# Patient Record
Sex: Male | Born: 1995 | Race: Black or African American | Hispanic: No | Marital: Single | Smoking: Current some day smoker
Health system: Southern US, Community
[De-identification: ages and names within clinical notes are randomized; demographics above are authoritative.]

## PROBLEM LIST (undated history)

## (undated) HISTORY — PX: CARDIAC SURGERY: SHX584

---
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
## 2015-11-05 ENCOUNTER — Ambulatory Visit: Payer: Medicaid Other | Attending: Pediatrics | Admitting: Pediatrics

## 2015-11-05 DIAGNOSIS — Q210 Ventricular septal defect: Secondary | ICD-10-CM | POA: Diagnosis not present

## 2017-08-26 ENCOUNTER — Other Ambulatory Visit: Payer: Self-pay

## 2017-08-26 ENCOUNTER — Emergency Department
Admission: EM | Admit: 2017-08-26 | Discharge: 2017-08-26 | Disposition: A | Discharge status: Home / Self Care | Payer: Managed Care, Other (non HMO) | Attending: Student in an Organized Health Care Education/Training Program | Admitting: Student in an Organized Health Care Education/Training Program

## 2017-08-26 ENCOUNTER — Encounter: Payer: Self-pay | Admitting: Emergency Medicine

## 2017-08-26 ENCOUNTER — Emergency Department: Payer: Managed Care, Other (non HMO)

## 2017-08-26 DIAGNOSIS — M79645 Pain in left finger(s): Secondary | ICD-10-CM | POA: Diagnosis present

## 2017-08-26 DIAGNOSIS — F17200 Nicotine dependence, unspecified, uncomplicated: Secondary | ICD-10-CM | POA: Diagnosis not present

## 2017-08-26 MED ORDER — TRAMADOL HCL 50 MG PO TABS: 50.0000 mg | ORAL_TABLET | Freq: Four times a day (QID) | ORAL | 0 refills | Status: AC | PRN

## 2017-08-26 MED ORDER — CEPHALEXIN 500 MG PO CAPS: 500.0000 mg | ORAL_CAPSULE | Freq: Three times a day (TID) | ORAL | 0 refills | Status: AC

## 2017-08-26 NOTE — Discharge Instructions (Signed)
Please move her hand is much as possible to help with any swelling.  Take antibiotics 3 times daily.  Continue taking Motrin and Tylenol to help with inflammation and discomfort.  The pain medication prescribed is for severe breakthrough pain.  Return to the ER for any fevers, worsening swelling, redness that persists particularly on the palm side of your hand for any additional questions or concerns.

## 2017-08-26 NOTE — ED Provider Notes (Signed)
[facility] Emergency Department Provider Note    First MD Initiated Contact with Patient 08/26/17 2218     (approximate)  I have reviewed the triage vital signs and the nursing notes.   HISTORY  Chief Complaint Hand Pain    HPI Steven Stein is a 22 y.o. male presents with several days of worsening left ring finger swelling and joint pain.  Pain is worse when making a fist.  No fevers.  No redness.  Denies any trauma but works with his hands doing frequent repetitive movements.  Pain is located mostly on the dorsal aspect of the left fourth digit particularly over the PIP.  There is painless passive range of motion.  Pain is primary located on the dorsal aspect along the extensor surface.  There is no fusiform swelling, erythema or tenderness over the flexor tendon sheath.  Neurovascularly intact distally.  Back side.  Has noted a few bumps and wonders if he got bit by something but does not recall anything.  No other systemic symptoms.  History reviewed. No pertinent past medical history. History reviewed. No pertinent family history. Past Surgical History:  Procedure Laterality Date  . CARDIAC SURGERY     There are no active problems to display for this patient.     Prior to Admission medications   Not on File    Allergies Patient has no allergy information on record.    Social History Social History   Tobacco Use  . Smoking status: Current Some Day Smoker  . Smokeless tobacco: Never Used  Substance Use Topics  . Alcohol use: Yes  . Drug use: Yes    Types: Marijuana    Comment: last smoked today    Review of Systems Patient denies headaches, rhinorrhea, blurry vision, numbness, shortness of breath, chest pain, edema, cough, abdominal pain, nausea, vomiting, diarrhea, dysuria, fevers, rashes or hallucinations unless otherwise stated above in HPI. ____________________________________________   PHYSICAL EXAM:  VITAL  SIGNS: Vitals:   08/26/17 2107  BP: (!) 157/92  Pulse: (!) 53  Resp: 17  Temp: 98.3 F (36.8 C)  SpO2: 98%    Constitutional: Alert and oriented. Well appearing and in no acute distress. Eyes: Conjunctivae are normal.  Head: Atraumatic. Nose: No congestion/rhinnorhea. Mouth/Throat: Mucous membranes are moist.   Neck: Painless ROM.  Cardiovascular:   Good peripheral circulation. Respiratory: Normal respiratory effort.  No retractions.  Gastrointestinal: Soft and nontender.  Musculoskeletal: No lower extremity tenderness .  No joint effusions.  Left hand does have some swelling and edema to Neurologic:  Normal speech and language. No gross focal neurologic deficits are appreciated.  Skin:  Skin is warm, dry and intact. No rash noted. Psychiatric: Mood and affect are normal. Speech and behavior are normal.  ____________________________________________   LABS (all labs ordered are listed, but only abnormal results are displayed)  No results found for this or any previous visit (from the past 24 hour(s)). ____________________________________________ ____________________________________________  RADIOLOGY  I personally reviewed all radiographic images ordered to evaluate for the above acute complaints and reviewed radiology reports and findings.  These findings were personally discussed with the patient.  Please see medical record for radiology report.  ____________________________________________   PROCEDURES  Procedure(s) performed:  Procedures    Critical Care performed: no ____________________________________________   INITIAL IMPRESSION / ASSESSMENT AND PLAN / ED COURSE  Pertinent labs & imaging results that were available during my care of the patient were reviewed by me and considered in my medical  decision making (see chart for details).  DDX: Tenosynovitis, cellulitis, arthritis, fracture, dislocation  Steven Stein is a 22 y.o. who presents to the ED  with symptoms as described above.  Not clinically consistent with flexor tenosynovitis.  No evidence of fracture.  Does not recall injury to suggest dislocation and otherwise has good range of motion without any instability.  Given the dorsal swelling possible insect bite with some warmth will treat with Keflex for possible component of cellulitis.  Will have patient follow-up with outpatient hand specialist.  Discussed signs and symptoms for which she should return to the ER.      ____________________________________________   FINAL CLINICAL IMPRESSION(S) / ED DIAGNOSES  Final diagnoses:  Finger pain, left      NEW MEDICATIONS STARTED DURING THIS VISIT:  New Prescriptions   No medications on file     Note:  This document was prepared using Dragon voice recognition software and may include unintentional dictation errors.     Willy Eddy, MD 08/26/17 2239

## 2017-08-26 NOTE — ED Triage Notes (Addendum)
Pt reports pain and swelling to 3rd and 4th digits of left hand for about 1 week; pt says symptoms are not getting any better; redness and warmth noted to fingers; denies injury; pt says areas were itching earlier; now now pain unless he makes a fist with that hand

## 2017-08-26 NOTE — ED Notes (Signed)
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

## 2019-06-12 ENCOUNTER — Other Ambulatory Visit: Payer: Self-pay

## 2019-06-12 DIAGNOSIS — F17200 Nicotine dependence, unspecified, uncomplicated: Secondary | ICD-10-CM | POA: Insufficient documentation

## 2019-06-12 DIAGNOSIS — W11XXXA Fall on and from ladder, initial encounter: Secondary | ICD-10-CM | POA: Insufficient documentation

## 2019-06-12 DIAGNOSIS — Y999 Unspecified external cause status: Secondary | ICD-10-CM | POA: Insufficient documentation

## 2019-06-12 DIAGNOSIS — M25551 Pain in right hip: Secondary | ICD-10-CM | POA: Insufficient documentation

## 2019-06-12 DIAGNOSIS — Y929 Unspecified place or not applicable: Secondary | ICD-10-CM | POA: Insufficient documentation

## 2019-06-12 DIAGNOSIS — Y939 Activity, unspecified: Secondary | ICD-10-CM | POA: Insufficient documentation

## 2019-06-12 DIAGNOSIS — S20211A Contusion of right front wall of thorax, initial encounter: Secondary | ICD-10-CM | POA: Insufficient documentation

## 2019-06-12 NOTE — ED Triage Notes (Signed)
Pt states he fell off aprox 4 foot ladder a week ago and is c/o of right side and right shoulder pain.  Pt aox4, NAD noted. No swelling or obvious deformity noted.

## 2019-06-13 ENCOUNTER — Emergency Department: Payer: Self-pay

## 2019-06-13 ENCOUNTER — Emergency Department
Admission: EM | Admit: 2019-06-13 | Discharge: 2019-06-13 | Disposition: A | Discharge status: Home / Self Care | Payer: Self-pay | Attending: Emergency Medicine | Admitting: Emergency Medicine

## 2019-06-13 DIAGNOSIS — S20211A Contusion of right front wall of thorax, initial encounter: Secondary | ICD-10-CM

## 2019-06-13 DIAGNOSIS — W19XXXA Unspecified fall, initial encounter: Secondary | ICD-10-CM

## 2019-06-13 MED ORDER — MELOXICAM 7.5 MG PO TABS
15.0000 mg | ORAL_TABLET | Freq: Once | ORAL | Status: AC
  Administered 2019-06-13: 15 mg via ORAL
  Filled 2019-06-13: qty 2

## 2019-06-13 MED ORDER — MELOXICAM 7.5 MG PO TABS: 7.5000 mg | ORAL_TABLET | Freq: Two times a day (BID) | ORAL | 0 refills | Status: AC

## 2019-06-13 NOTE — ED Provider Notes (Signed)
[facility] Emergency Department Provider Note  Time seen: 1:51 AM  I have reviewed the triage vital signs and the nursing notes.   HISTORY  Chief Complaint Fall   HPI Steven Stein is a 23 y.o. male with no past medical history presents to the emergency department for right-sided pain after falling off a ladder 1 week ago.  According to the patient he is approximately 4 feet up on a ladder when he fell off 1 week ago landing on his right side.  Patient states mild pain to his right chest after the fall however several days later he developed more significant pain to this area.  States he has had mild pain to the right hip as well but able to ambulate.  Patient states he was at work today when he stretched and felt like he injured the right side of his chest.  States intermittent pain denies any pain currently states when it does occur it is sharp and last several seconds.  History reviewed. No pertinent past medical history.  There are no problems to display for this patient.   Past Surgical History:  Procedure Laterality Date  . CARDIAC SURGERY      Prior to Admission medications   Not on File    No Known Allergies  History reviewed. No pertinent family history.  Social History Social History   Tobacco Use  . Smoking status: Current Some Day Smoker  . Smokeless tobacco: Never Used  Substance Use Topics  . Alcohol use: Yes  . Drug use: Yes    Types: Marijuana    Comment: last smoked today    Review of Systems Constitutional: Negative for fever. Cardiovascular: Negative for chest pain. Respiratory: Negative for shortness of breath. Gastrointestinal: Negative for abdominal pain Musculoskeletal: Right chest wall pain.  Right hip pain Neurological: Negative for headache All other ROS negative  ____________________________________________   PHYSICAL EXAM:  VITAL SIGNS: ED Triage Vitals  Enc Vitals Group     BP 06/12/19 2130 (!)  150/89     Pulse Rate 06/12/19 2130 70     Resp 06/12/19 2130 18     Temp 06/12/19 2130 98.8 F (37.1 C)     Temp Source 06/12/19 2130 Oral     SpO2 06/12/19 2130 100 %     Weight 06/12/19 2132 132 lb (59.9 kg)     Height 06/12/19 2132 5\' 6"  (1.676 m)     Head Circumference --      Peak Flow --      Pain Score 06/12/19 2131 6     Pain Loc --      Pain Edu? --      Excl. in GC? --    Constitutional: Alert and oriented. Well appearing and in no distress. Eyes: Normal exam ENT      Head: Normocephalic and atraumatic.      Mouth/Throat: Mucous membranes are moist. Cardiovascular: Normal rate, regular rhythm.  Respiratory: Normal respiratory effort without tachypnea nor retractions. Breath sounds are clear.  Nontender to palpation. Gastrointestinal: Soft and nontender. No distention.   Musculoskeletal: Normal range of motion all extremities including right hip with no tenderness elicited. Neurologic:  Normal speech and language. No gross focal neurologic deficits  Skin:  Skin is warm, dry and intact.  Psychiatric: Mood and affect are normal.   ____________________________________________     RADIOLOGY  IMPRESSION:  1. No acute cardiopulmonary findings. No visible displaced rib  fracture or pneumothorax.  2. Remote postsurgical  changes from prior sternotomy with fracture  of the inferior most wire.   ____________________________________________   INITIAL IMPRESSION / ASSESSMENT AND PLAN / ED COURSE  Pertinent labs & imaging results that were available during my care of the patient were reviewed by me and considered in my medical decision making (see chart for details).   Patient presents to the emergency department for a fall 1 week ago with continued pain to his right rib cage and right hip.  States he was stretching tonight and felt more significant pain in the right ribs is concerned he could have injured something.  We will obtain chest x-ray as a precaution with right  rib films.  Patient has a nontender hip with great range of motion likely contusion.  X-ray negative for acute rib fracture.  We will discharge with meloxicam and PCP follow-up.  Steven Stein was evaluated in Emergency Department on 06/13/2019 for the symptoms described in the history of present illness. He was evaluated in the context of the global COVID-19 pandemic, which necessitated consideration that the patient might be at risk for infection with the SARS-CoV-2 virus that causes COVID-19. Institutional protocols and algorithms that pertain to the evaluation of patients at risk for COVID-19 are in a state of rapid change based on information released by regulatory bodies including the CDC and federal and state organizations. These policies and algorithms were followed during the patient's care in the ED.  ____________________________________________   FINAL CLINICAL IMPRESSION(S) / ED DIAGNOSES  Fall Contusion   [name], MD 06/13/19 [phone]

## 2020-10-10 IMAGING — CR DG RIBS W/ CHEST 3+V*R*
5 series · 5 of 5 positions shown · non-contrast
Comparison: None.

CLINICAL DATA: Fall from ladder with right chest and shoulder pain

EXAM:
RIGHT RIBS AND CHEST - 3+ VIEW

[chest pa]
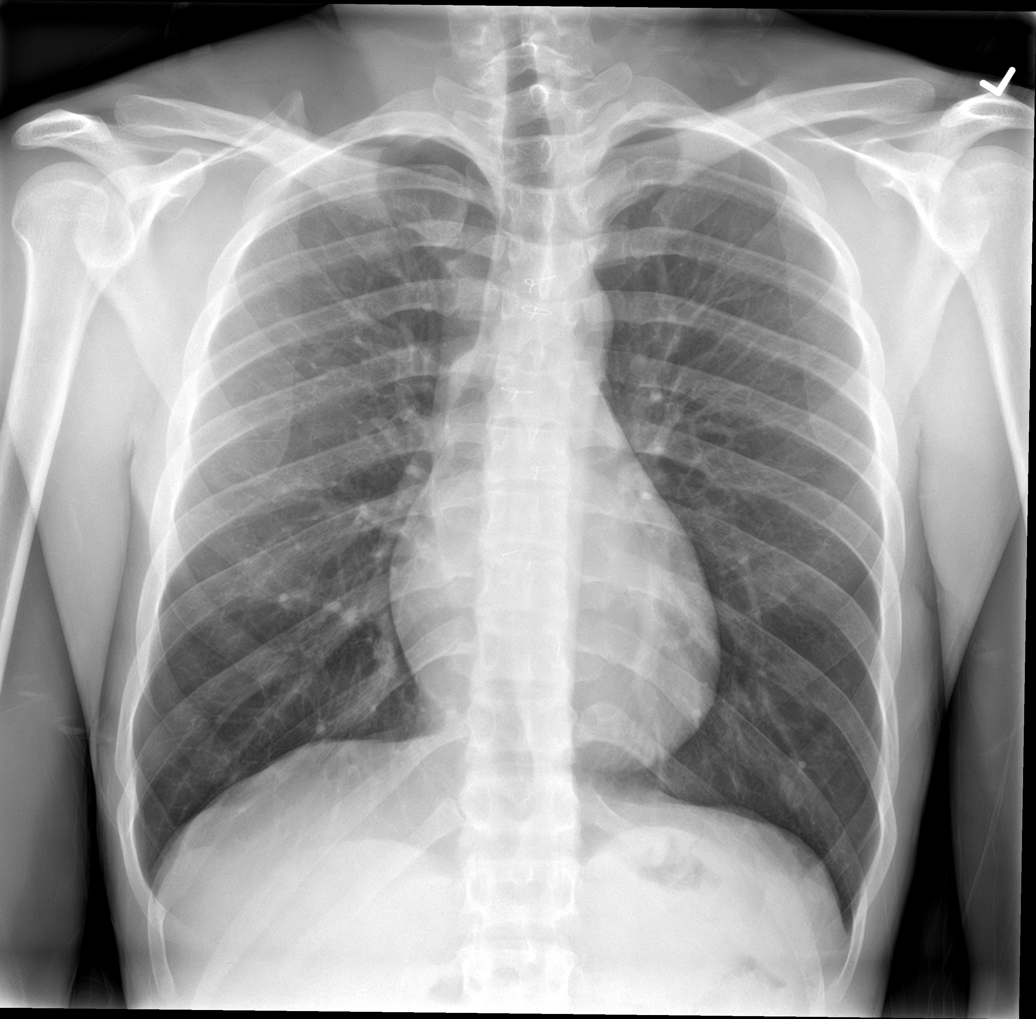

[rib pa]
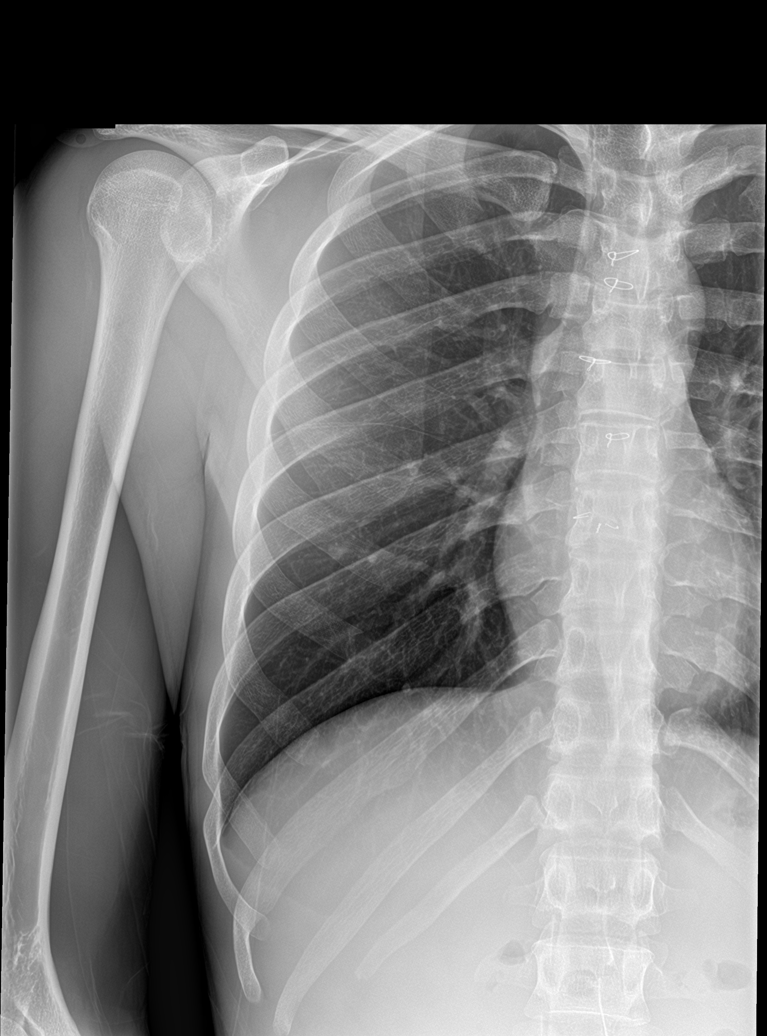

[rib pa obl (1 of 2)]
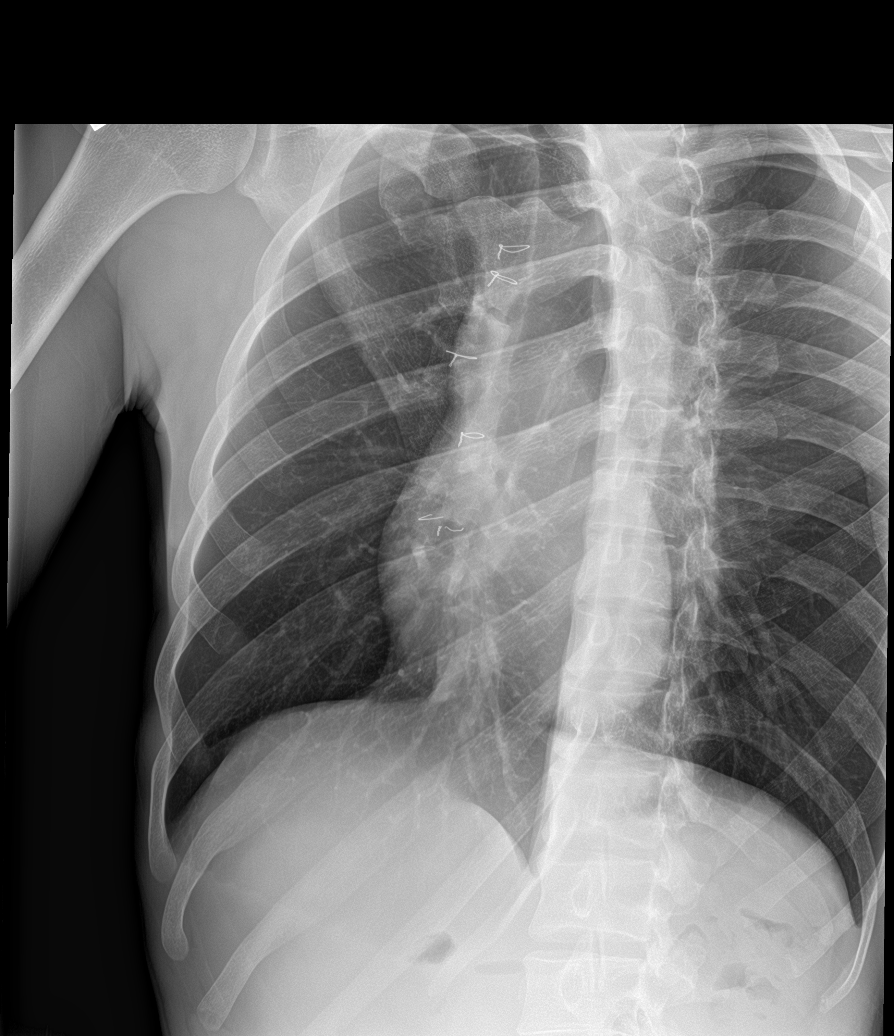

[rib ap]
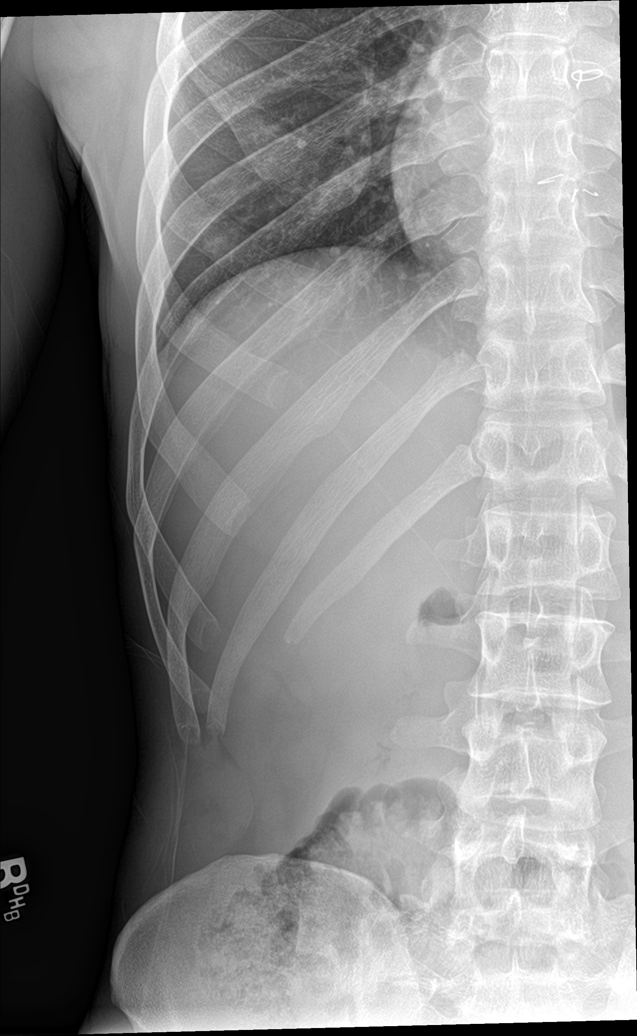

[rib pa obl (2 of 2)]
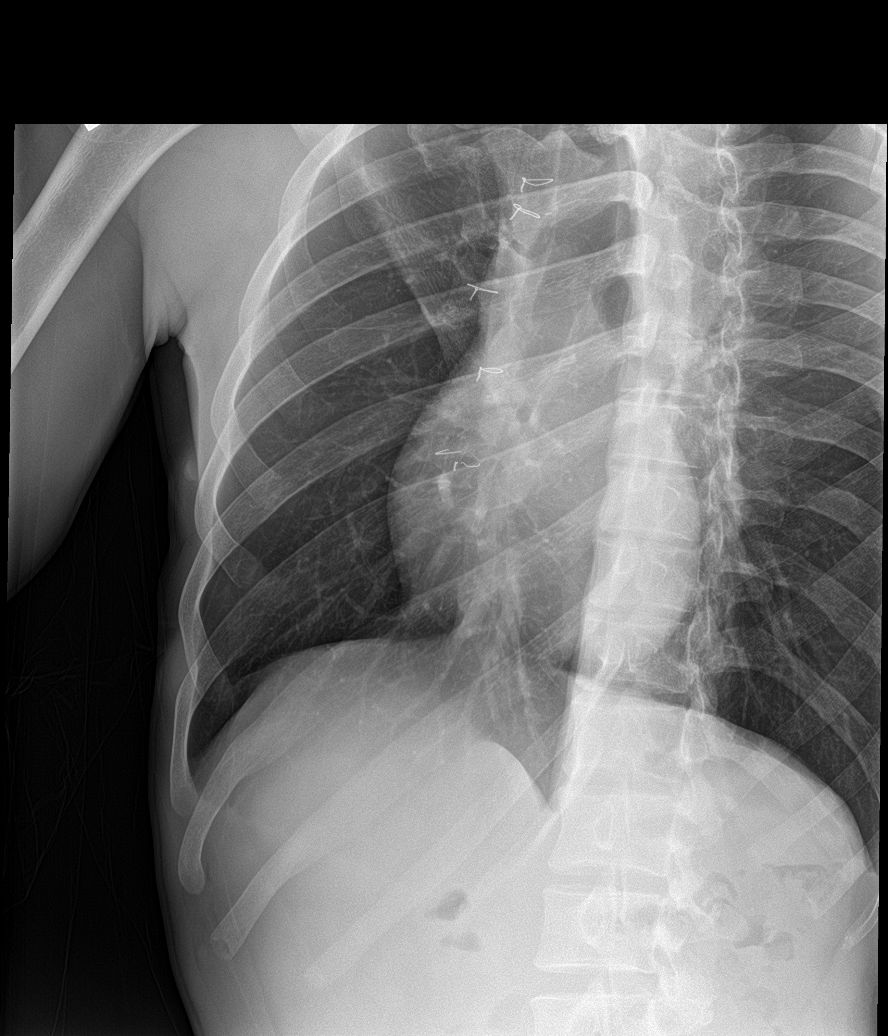

[5 of 5 positions shown; findings below may reference images not displayed]

FINDINGS: Postsurgical changes from prior sternotomy with fracture of the
inferior most wire. Remaining sternal sutures are intact. No visible
displaced rib fracture is seen. No acute osseous abnormality or
suspicious osseous lesions. No consolidation, features of edema,
pneumothorax, or effusion. The cardiomediastinal contours are
unremarkable. No acute soft tissue abnormality.
IMPRESSION: 1. No acute cardiopulmonary findings. No visible displaced rib
fracture or pneumothorax.
2. Remote postsurgical changes from prior sternotomy with fracture
of the inferior most wire.

## 2022-02-18 ENCOUNTER — Encounter: Payer: Self-pay | Admitting: Emergency Medicine

## 2022-02-18 ENCOUNTER — Emergency Department
Admission: EM | Admit: 2022-02-18 | Discharge: 2022-02-18 | Disposition: A | Discharge status: Home / Self Care | Payer: Self-pay | Attending: Emergency Medicine | Admitting: Emergency Medicine

## 2022-02-18 ENCOUNTER — Emergency Department: Payer: Self-pay

## 2022-02-18 DIAGNOSIS — X500XXA Overexertion from strenuous movement or load, initial encounter: Secondary | ICD-10-CM | POA: Insufficient documentation

## 2022-02-18 DIAGNOSIS — R072 Precordial pain: Secondary | ICD-10-CM | POA: Insufficient documentation

## 2022-02-18 DIAGNOSIS — R0789 Other chest pain: Secondary | ICD-10-CM

## 2022-02-18 LAB — BASIC METABOLIC PANEL
Anion gap: 10 (ref 5–15)
BUN: 16 mg/dL (ref 6–20)
CO2: 25 mmol/L (ref 22–32)
Calcium: 9.3 mg/dL (ref 8.9–10.3)
Chloride: 103 mmol/L (ref 98–111)
Creatinine, Ser: 0.98 mg/dL (ref 0.61–1.24)
GFR, Estimated: >60 mL/min (ref >60)
Glucose, Bld: 90 mg/dL (ref 70–99)
Potassium: 3.8 mmol/L (ref 3.5–5.1)
Sodium: 138 mmol/L (ref 135–145)

## 2022-02-18 LAB — CBC
HCT: 44.7 % (ref 39.0–52.0)
Hemoglobin: 14.5 g/dL (ref 13.0–17.0)
MCH: 31.4 pg (ref 26.0–34.0)
MCHC: 32.4 g/dL (ref 30.0–36.0)
MCV: 96.8 fL (ref 80.0–100.0)
Platelets: 307 10*3/uL (ref 150–400)
RBC: 4.62 MIL/uL (ref 4.22–5.81)
RDW: 12.1 % (ref 11.5–15.5)
WBC: 8 10*3/uL (ref 4.0–10.5)
nRBC: 0 % (ref 0.0–0.2)

## 2022-02-18 LAB — TROPONIN I (HIGH SENSITIVITY)
Troponin I (High Sensitivity): 3 ng/L (ref <18)
Troponin I (High Sensitivity): 5 ng/L (ref <18)

## 2022-02-18 MED ORDER — MELOXICAM 15 MG PO TABS: 15.0000 mg | ORAL_TABLET | Freq: Every day | ORAL | 2 refills | Status: DC

## 2022-02-18 NOTE — ED Notes (Signed)
See triage note.  Ambulatory to room.  Says chest pain for a long time.  Says when he moves it happens.  Midchest.  And top right of shoulder.

## 2022-02-18 NOTE — ED Triage Notes (Signed)
Pt presents via POV with complaints of left sided CP that has been present for several months intermmittently but tonight was much worse. Rates the pain a 6/10 when he is having the episodes - no pain at this time. No meds taken PTA. Denies fevers, chills, cough, or SOB.

## 2022-02-18 NOTE — Discharge Instructions (Signed)
Try scheduling an appointment with one of the above discount clinic since she does not have health insurance.  You need a regular physical.  Take the meloxicam daily as prescribed.  Do not take ibuprofen or Aleve with this medication.  You may take Tylenol with this medication.  Stop vaping and smoking this is not helping the situation.  Return if worsening

## 2022-02-18 NOTE — ED Provider Notes (Addendum)
[facility] Provider Note    Event Date/Time   First MD Initiated Contact with Patient 02/18/22 [phone]     (approximate)   History   Chest Pain   HPI  Steven Stein is a 26 y.o. male with no significant past medical history presents emergency department complaining of midsternal chest pain that radiates to the right shoulder.  Not associated with eating.  Patient does work lifting heavy objects like furniture.  States the pain comes and goes.  Is been going on for "a minute ".  I try to qualify that the minute better and he states been going on for almost a year.  He denies any diaphoresis, vomiting, shortness of breath.      Physical Exam   Triage Vital Signs: ED Triage Vitals  Enc Vitals Group     BP 02/18/22 0047 (!) 137/105     Pulse Rate 02/18/22 0047 (!) 51     Resp 02/18/22 0047 20     Temp 02/18/22 0047 97.8 F (36.6 C)     Temp Source 02/18/22 0047 Oral     SpO2 02/18/22 0047 100 %     Weight 02/18/22 0050 136 lb 11 oz (62 kg)     Height 02/18/22 0049 5\' 6"  (1.676 m)     Head Circumference --      Peak Flow --      Pain Score 02/18/22 0049 0     Pain Loc --      Pain Edu? --      Excl. in GC? --     Most recent vital signs: Vitals:   02/18/22 0336 02/18/22 0644  BP: 129/84 129/81  Pulse: 60 64  Resp: 15 16  Temp: 97.6 F (36.4 C) 97.6 F (36.4 C)  SpO2: 100% 100%     General: Awake, no distress.    CV:  Good peripheral perfusion.  regular rate and  rhythm Resp:  Normal effort. Lungs  Abd:  No distention.   Other:  Chest wall is nontender to palpation, pain is reproduced and the patient reached overhead and laid back on the stretcher.   ED Results / Procedures / Treatments   Labs (all labs ordered are listed, but only abnormal results are displayed) Labs Reviewed  BASIC METABOLIC PANEL  CBC  TROPONIN I (HIGH SENSITIVITY)  TROPONIN I (HIGH SENSITIVITY)     EKG  EKG    RADIOLOGY  Chest  x-ray    PROCEDURES:   Procedures   MEDICATIONS ORDERED IN ED: Medications - No data to display   IMPRESSION / MDM / ASSESSMENT AND PLAN / ED COURSE  I reviewed the triage vital signs and the nursing notes.                              Differential diagnosis includes, but is not limited to, chest wall pain, MI, muscle strain, reflux, esophagitis  Patient's presentation is most consistent with acute presentation with potential threat to life or bodily function.   The patient's labs are reassuring, CBC, metabolic panel and troponin are all normal  Chest x-ray independently reviewed and interpreted by me as being negative for any acute abnormality  EKG is negative for STEMI  I did explain everything to the patient.  His pain is reproduced with movement.  He does not want a muscle relaxer as he states it tightens up his bowels.  Explained to him I  can give him a anti-inflammatory takes once a day.  He is not to take other anti-inflammatories.  Patient does smoke and vape I did advise him to stop smoking and vaping as this is not helping his situation.  He states he understands.  Patient was discharged with a work note and a prescription for meloxicam.      FINAL CLINICAL IMPRESSION(S) / ED DIAGNOSES   Final diagnoses:  Chest wall pain     Rx / DC Orders   ED Discharge Orders          Ordered    meloxicam (MOBIC) 15 MG tablet  Daily        02/18/22 0733             Note:  This document was prepared using Dragon voice recognition software and may include unintentional dictation errors.    Faythe Ghee, PA-C 02/18/22 0734    Faythe Ghee, PA-C 02/18/22 4174    Sharman Cheek, MD 02/18/22 [phone]

## 2022-07-20 ENCOUNTER — Encounter (HOSPITAL_COMMUNITY): Payer: Self-pay

## 2022-07-20 ENCOUNTER — Emergency Department (HOSPITAL_COMMUNITY)
Admission: EM | Admit: 2022-07-20 | Discharge: 2022-07-20 | Discharge status: Against Medical Advice | Payer: Self-pay | Attending: Emergency Medicine | Admitting: Emergency Medicine

## 2022-07-20 DIAGNOSIS — Y92838 Other recreation area as the place of occurrence of the external cause: Secondary | ICD-10-CM | POA: Insufficient documentation

## 2022-07-20 DIAGNOSIS — Z5321 Procedure and treatment not carried out due to patient leaving prior to being seen by health care provider: Secondary | ICD-10-CM | POA: Insufficient documentation

## 2022-07-20 DIAGNOSIS — W1830XA Fall on same level, unspecified, initial encounter: Secondary | ICD-10-CM | POA: Insufficient documentation

## 2022-07-20 DIAGNOSIS — S30810A Abrasion of lower back and pelvis, initial encounter: Secondary | ICD-10-CM | POA: Insufficient documentation
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
## 2022-11-08 ENCOUNTER — Other Ambulatory Visit (HOSPITAL_COMMUNITY)
Admission: EM | Admit: 2022-11-08 | Discharge: 2022-11-09 | Disposition: A | Discharge status: Home / Self Care | Payer: 59 | Attending: Psychiatry | Admitting: Psychiatry

## 2022-11-08 DIAGNOSIS — F1020 Alcohol dependence, uncomplicated: Secondary | ICD-10-CM | POA: Diagnosis present

## 2022-11-08 DIAGNOSIS — F330 Major depressive disorder, recurrent, mild: Secondary | ICD-10-CM | POA: Diagnosis not present

## 2022-11-08 DIAGNOSIS — F17210 Nicotine dependence, cigarettes, uncomplicated: Secondary | ICD-10-CM | POA: Diagnosis not present

## 2022-11-08 LAB — POCT URINE DRUG SCREEN - MANUAL ENTRY (I-SCREEN)
POC Amphetamine UR: POSITIVE — AB
POC Buprenorphine (BUP): NOT DETECTED
POC Cocaine UR: NOT DETECTED
POC Marijuana UR: NOT DETECTED
POC Methadone UR: NOT DETECTED
POC Methamphetamine UR: NOT DETECTED
POC Morphine: NOT DETECTED
POC Oxazepam (BZO): NOT DETECTED
POC Oxycodone UR: NOT DETECTED
POC Secobarbital (BAR): NOT DETECTED

## 2022-11-08 MED ORDER — THIAMINE HCL 100 MG/ML IJ SOLN
100.0000 mg | Freq: Once | INTRAMUSCULAR | Status: AC
  Administered 2022-11-08: 100 mg via INTRAMUSCULAR
  Filled 2022-11-08: qty 2

## 2022-11-08 MED ORDER — LORAZEPAM 1 MG PO TABS: 1.0000 mg | ORAL_TABLET | Freq: Four times a day (QID) | ORAL | Status: DC | PRN

## 2022-11-08 MED ORDER — MAGNESIUM HYDROXIDE 400 MG/5ML PO SUSP: 30.0000 mL | Freq: Every day | ORAL | Status: DC | PRN

## 2022-11-08 MED ORDER — ACETAMINOPHEN 325 MG PO TABS: 650.0000 mg | ORAL_TABLET | Freq: Four times a day (QID) | ORAL | Status: DC | PRN

## 2022-11-08 MED ORDER — ONDANSETRON 4 MG PO TBDP: 4.0000 mg | ORAL_TABLET | Freq: Four times a day (QID) | ORAL | Status: DC | PRN

## 2022-11-08 MED ORDER — THIAMINE MONONITRATE 100 MG PO TABS
100.0000 mg | ORAL_TABLET | Freq: Every day | ORAL | Status: DC
  Administered 2022-11-09: 100 mg via ORAL
  Filled 2022-11-08: qty 1

## 2022-11-08 MED ORDER — LOPERAMIDE HCL 2 MG PO CAPS: 2.0000 mg | ORAL_CAPSULE | ORAL | Status: DC | PRN

## 2022-11-08 MED ORDER — ALUM & MAG HYDROXIDE-SIMETH 200-200-20 MG/5ML PO SUSP: 30.0000 mL | ORAL | Status: DC | PRN

## 2022-11-08 MED ORDER — ADULT MULTIVITAMIN W/MINERALS CH
1.0000 | ORAL_TABLET | Freq: Every day | ORAL | Status: DC
  Administered 2022-11-09: 1 via ORAL
  Filled 2022-11-08: qty 1

## 2022-11-08 MED ORDER — HYDROXYZINE HCL 25 MG PO TABS
25.0000 mg | ORAL_TABLET | Freq: Four times a day (QID) | ORAL | Status: DC | PRN
  Administered 2022-11-08: 25 mg via ORAL
  Filled 2022-11-08: qty 1

## 2022-11-08 NOTE — Progress Notes (Signed)
Pt arrived at [facility] with a friend. Pt was seen by himself by this clinician. Pt says he "can't focus right, putting myself down, not thinking right." He works but feels like his attention is scattered. Pt says he is not suicidal. No prevous attempts. Pt says he drinks "in spells, I try hard not to." He says he is an "occasional drinker." He drinks malt liquor or wine. Describes drinking socially but has been drinking more at home lately. Pt reports drinking half a bottle of wine PTA. Smokes marijuana <1x/M. Pt denies any HI. Pt denies seeing things or hearing things. Pt reports no guns in the home. Appetite is described as "off and on." Pt cannot say for sure how much sleep he gets. Patient does not have outpatient mental health care. Pt lives with grandparents.  Pt is routine.

## 2022-11-08 NOTE — ED Provider Notes (Signed)
Facility Based Crisis Admission H&P  Date: 11/09/22 Patient Name: Steven Stein MRN: [id number] Chief Complaint: "Drinking problem"  Diagnoses:  Final diagnoses:  Alcohol use disorder, severe, dependence (HCC)  Mild episode of recurrent major depressive disorder (HCC)  Cigarette smoker    HPI: Steven Stein is a 27 year old male with no pertinent psychiatric history, who presented voluntarily as a walk-in to [facility] accompanied by a friend Jocelyn Lamer (patient reports he does not know this individual's phone number) with complaints of depression and severe alcohol abuse.  Patient was seen face-to-face by this provider and chart reviewed. On evaluation, patient is alert, oriented x 4, and cooperative. Speech is clear and coherent. Pt appears casual. Eye contact is fair. Mood is depressed, hopeless, affect is flat and congruent with mood. Thought process is coherent and thought content is WDL. Pt denies SI/HI/AVH or paranoia. There is no objective indication that the patient is responding to internal stimuli. No delusions elicited during this assessment.    Patient reports "responsibilities... I'm frustrated, I drink when I get frustrated, I was referred here for some help because of my drinking from me being frustrated".   Patient reports difficulty with decision making but is unable to elaborate and instead reports "the commitment on certain things, like to not want to do one thing, commitment to all tasks, it's kind of hard for me, I want to do something, then I don't follow through with it, then I get frustrated and I drink, I have always had commitment problems".  Patient is unable to identify specific commitment problems/stressors.  Patient endorses alcohol use of beer/wine and malt liquor "since I got out of high school".  "I drink 16 ounces in less than 24 hours, but I used to drink more and I occasionally smoke a cigarette once in while, I want to do better myself, but I can't do  it, I last drank before I got here, I drank a half 16 ounce of wine".  Patient denies current withdrawal symptoms.  Patient denies SI, denies HI, denies AVH or paranoia.  Patient denies other substance use.  Patient reports he lives with his grandparents and is unemployed.  Patient denies access to means to a gun or weapon.  Patient denies a history of suicide attempts or self-harm behaviors.  Patient is currently not established with outpatient psychiatric services.  Patient denies a history of substance abuse/rehab program stay or inpatient psychiatric stay.  Patient is currently not on any psychiatric medications.  Patient is seeking alcohol detox/treatment.  Patient completed a PHQ-9 questionnaire and obtained a score of 7, indicating mild depression.  Support, encouragement, reassurance provided about ongoing stressors.  Patient was provided with opportunity for questions.  Discussed recommendation for admission to the [facility] for substance abuse treatment/detox and stabilization. Patient is provided with opportunity for questions.  Discussed FBC milieu and expectations.  Patient is in agreement.   PHQ 2-9:   Flowsheet Row ED from 11/08/2022 in [facility] ED from 07/20/2022 in [facility] Emergency Department at [facility] ED from 02/18/2022 in [facility] Emergency Department at [facility]  C-SSRS RISK CATEGORY No Risk No Risk No Risk         Total Time spent with patient: 20 minutes  Musculoskeletal  Strength & Muscle Tone: within normal limits Gait & Station: normal Patient leans: N/A  Psychiatric Specialty Exam  Presentation General Appearance:  Casual  Eye Contact: Fair  Speech: Clear and Coherent  Speech Volume:  Normal  Handedness: Right   Mood and Affect  Mood: Depressed; Hopeless  Affect: Congruent; Flat   Thought Process  Thought Processes: Coherent  Descriptions of  Associations:Intact  Orientation:Full (Time, Place and Person)  Thought Content:WDL    Hallucinations:Hallucinations: None  Ideas of Reference:None  Suicidal Thoughts:Suicidal Thoughts: No  Homicidal Thoughts:Homicidal Thoughts: No   Sensorium  Memory: Immediate Fair  Judgment: Poor  Insight: Poor   Executive Functions  Concentration: Fair  Attention Span: Good  Recall: Fair  Fund of Knowledge: Good  Language: Good   Psychomotor Activity  Psychomotor Activity: Psychomotor Activity: Normal   Assets  Assets: Communication Skills; Desire for Improvement   Sleep  Sleep: Sleep: Fair   Nutritional Assessment (For OBS and FBC admissions only) Has the patient had a weight loss or gain of 10 pounds or more in the last 3 months?: No Has the patient had a decrease in food intake/or appetite?: No Does the patient have dental problems?: No Does the patient have eating habits or behaviors that may be indicators of an eating disorder including binging or inducing vomiting?: No Has the patient recently lost weight without trying?: 0 Has the patient been eating poorly because of a decreased appetite?: 0 Malnutrition Screening Tool Score: 0    Physical Exam Constitutional:      General: He is not in acute distress.    Appearance: He is not diaphoretic.  HENT:     Head: Normocephalic.     Right Ear: External ear normal.     Left Ear: External ear normal.     Nose: No congestion.  Eyes:     General:        Right eye: No discharge.        Left eye: No discharge.  Pulmonary:     Effort: No respiratory distress.  Chest:     Chest wall: No tenderness.  Neurological:     Mental Status: He is alert and oriented to person, place, and time.  Psychiatric:        Attention and Perception: Attention and perception normal.        Mood and Affect: Mood is depressed. Affect is flat.        Speech: Speech normal.        Behavior: Behavior is cooperative.         Thought Content: Thought content normal. Thought content is not paranoid or delusional. Thought content does not include homicidal or suicidal ideation. Thought content does not include homicidal or suicidal plan.        Cognition and Memory: Cognition normal.    Review of Systems  Constitutional:  Negative for chills, diaphoresis and fever.  HENT:  Negative for congestion.   Eyes:  Negative for discharge.  Respiratory:  Negative for cough, shortness of breath and wheezing.   Cardiovascular:  Negative for chest pain and palpitations.  Gastrointestinal:  Negative for diarrhea, nausea and vomiting.  Neurological:  Negative for dizziness, seizures, loss of consciousness, weakness and headaches.  Psychiatric/Behavioral:  Positive for depression and substance abuse.     Blood pressure (!) 155/91, pulse (!) 48, temperature 98.6 F (37 C), temperature source Oral, resp. rate 18, SpO2 100 %. There is no height or weight on file to calculate BMI.  Past Psychiatric History: N/A   Is the patient at risk to self? No  Has the patient been a risk to self in the past 6 months? No .    Has the patient been a  risk to self within the distant past? No   Is the patient a risk to others? No   Has the patient been a risk to others in the past 6 months? No   Has the patient been a risk to others within the distant past? No   Past Medical History: N/A Family History: N/A Social History: N/A  Last Labs:  Admission on 11/08/2022  Component Date Value Ref Range Status   POC Amphetamine UR 11/08/2022 Positive (A)  NONE DETECTED (Cut Off Level 1000 ng/mL) Final   POC Secobarbital (BAR) 11/08/2022 None Detected  NONE DETECTED (Cut Off Level 300 ng/mL) Final   POC Buprenorphine (BUP) 11/08/2022 None Detected  NONE DETECTED (Cut Off Level 10 ng/mL) Final   POC Oxazepam (BZO) 11/08/2022 None Detected  NONE DETECTED (Cut Off Level 300 ng/mL) Final   POC Cocaine UR 11/08/2022 None Detected  NONE DETECTED (Cut  Off Level 300 ng/mL) Final   POC Methamphetamine UR 11/08/2022 None Detected  NONE DETECTED (Cut Off Level 1000 ng/mL) Final   POC Morphine 11/08/2022 None Detected  NONE DETECTED (Cut Off Level 300 ng/mL) Final   POC Methadone UR 11/08/2022 None Detected  NONE DETECTED (Cut Off Level 300 ng/mL) Final   POC Oxycodone UR 11/08/2022 None Detected  NONE DETECTED (Cut Off Level 100 ng/mL) Final   POC Marijuana UR 11/08/2022 None Detected  NONE DETECTED (Cut Off Level 50 ng/mL) Final    Allergies: Patient has no known allergies.  Medications:  Facility Ordered Medications  Medication   acetaminophen (TYLENOL) tablet 650 mg   alum & mag hydroxide-simeth (MAALOX/MYLANTA) 200-200-20 MG/5ML suspension 30 mL   magnesium hydroxide (MILK OF MAGNESIA) suspension 30 mL   [COMPLETED] thiamine (VITAMIN B1) injection 100 mg   thiamine (VITAMIN B1) tablet 100 mg   multivitamin with minerals tablet 1 tablet   LORazepam (ATIVAN) tablet 1 mg   hydrOXYzine (ATARAX) tablet 25 mg   loperamide (IMODIUM) capsule 2-4 mg   ondansetron (ZOFRAN-ODT) disintegrating tablet 4 mg   PTA Medications  Medication Sig   meloxicam (MOBIC) 15 MG tablet Take 1 tablet (15 mg total) by mouth daily.    Long Term Goals: Improvement in symptoms so as ready for discharge  Short Term Goals: Patient will verbalize feelings in meetings with treatment team members., Patient will attend at least of 50% of the groups daily., Pt will complete the PHQ9 on admission, day 3 and discharge., Patient will participate in completing the [place] Suicide Severity Rating Scale, Patient will score a low risk of violence for 24 hours prior to discharge, and Patient will take medications as prescribed daily.  Medical Decision Making  Patient is seeking substance abuse treatment/detox and stabilization for alcohol abuse with severe dependence.  Patient meets criteria for admission to the [facility] for substance abuse treatment due to severe alcohol use  disorder.  Lab Orders         CBC with Differential/Platelet         Comprehensive metabolic panel         Hemoglobin A1c         Ethanol         Lipid panel         TSH         POCT Urine Drug Screen - (I-Screen)     EKG  Initiate Prn CIWA protocol -lorazepam 1 mg every 6 hours prn for CIWA >10 -thiamine 100 mg daily for nutritional supplementation -hydroxyzine 25 mg every 6 hours  prn for anxiety, CIWA < or = 10 -ondansetron 4 mg ODT every 6 hours prn nausea/vomiting -loperamide 2-4 mg capsule prn diarrhea or loose stools  Multivitamin with minerals tablet p.o. daily nutrition support Vitamin B1 100 mg p.o. daily  Prn meds- -Tylenol 650 mg p.o. every 6 hours as needed pain -Maalox 30 mL p.o. every 4 hours as needed indigestion -MOM 30 ml p.o. daily as needed constipation   Recommendations  Based on my evaluation the patient does not appear to have an emergency medical condition.  Recommend admission to the [facility] for substance abuse treatment and stabilization. Recommend CIWA protocol  Mancel Bale, NP 11/09/22  12:30 AM

## 2022-11-08 NOTE — BH Assessment (Addendum)
Comprehensive Clinical Assessment (CCA) Note  11/08/2022 Steven Stein [id number] Disposition: Pt came in voluntarily to [facility].  He was seen by this clinician for his triage and CCA.  Pt has been seen by Rockney Ghee, NP who did his MSE.  [place] recommended patient for [facility].  Patient has a flat affect, no smiles, is not responsive.  Pt has fair eye contact and is oriented x4.  Patient does not evidence any delusional thought content.  He is not responding to internal stimuli.  Patient cannot give good information, often says "I don't know."  Patient says his appetite is "off and on."  Pt is unclear aobut sleep.  Judgement is fair.    Pt has no outpatient psychiatric care.     Chief Complaint:  Chief Complaint  Patient presents with   Addiction Problem   Depression   Visit Diagnosis: ETOH use d/o severe; MDD single episode modeate    CCA Screening, Triage and Referral (STR)  Patient Reported Information How did you hear about [place]? Family/Friend  What Is the Reason for Your Visit/Call Today? Pt arrived at [facility] with a friend.  Pt was seen by himself by this clinician.  Pt says he "can't focus right, putting myself down, not thinking right."  He works but feels like his attention is scattered.  Pt says he is not suicidal.  No prevous attempts.  Pt says he drinks "in spells, I try hard not to."  He says he is an "occasional drinker."  He drinks malt liquor or wine.  Describes drinking socially but has been drinking more at home lately.  Pt reports drinking half a bottle of wine PTA.  Smokes marijuana <1x/M.  Pt denies any HI.  Pt denies seeing things or hearing things.  Pt reports no guns in the home.  Appetite is described as "off and on."  Pt cannot say for sure how much sleep he gets.  Patient does not have outpatient mental health care.  Pt lives with grandparents.  How Long Has This Been Causing You Problems? 1 wk - 1 month  What Do You Feel Would Help You the Most Today?  Alcohol or Drug Use Treatment; Treatment for Depression or other mood problem   Have You Recently Had Any Thoughts About Hurting Yourself? No  Are You Planning to Commit Suicide/Harm Yourself At This time? No   Flowsheet Row ED from 11/08/2022 in [facility] ED from 07/20/2022 in [facility] Emergency Department at [facility] ED from 02/18/2022 in [facility] Emergency Department at [facility]  C-SSRS RISK CATEGORY No Risk No Risk No Risk       Have you Recently Had Thoughts About Hurting Someone Steven Stein? No  Are You Planning to Harm Someone at This Time? No  Explanation: No SI or HI   Have You Used Any Alcohol or Drugs in the Past 24 Hours? Yes  What Did You Use and How Much? Half a bottle of wine.   Do You Currently Have a Therapist/Psychiatrist? No  Name of Therapist/Psychiatrist: Name of Therapist/Psychiatrist: None   Have You Been Recently Discharged From Any Office Practice or Programs? No  Explanation of Discharge From Practice/Program: None     CCA Screening Triage Referral Assessment Type of Contact: Face-to-Face  Telemedicine Service Delivery:   Is this Initial or Reassessment?   Date Telepsych consult ordered in [facility]:    Time Telepsych consult ordered in [facility]:    Location of Assessment: [facility] Assessment Services  Provider Location: [facility] Assessment Services   Collateral Involvement: N/A   Does Patient Have a Automotive engineer Guardian? No  Legal Guardian Contact Information: No legal guardian  Copy of Legal Guardianship Form: -- (No legal guardian)  Legal Guardian Notified of Arrival: -- (No legal guardian)  Legal Guardian Notified of Pending Discharge: -- (No legal guardian)  If Minor and Not Living with Parent(s), Who has Custody? Pt is a adult  Is CPS involved or ever been involved? Never  Is APS involved or ever been involved? Never   Patient Determined To Be At Risk for Harm To Self or  Others Based on Review of Patient Reported Information or Presenting Complaint? No  Method: No Plan  Availability of Means: No access or NA  Intent: -- (No SI or HI.)  Notification Required: No need or identified person  Additional Information for Danger to Others Potential: -- (No SI or HI)  Additional Comments for Danger to Others Potential: None  Are There Guns or Other Weapons in Your Home? No  Types of Guns/Weapons: None reported  Are These Weapons Safely Secured?                            No  Who Could Verify You Are Able To Have These Secured: Grandparents, no weapons.  Do You Have any Outstanding Charges, Pending Court Dates, Parole/Probation? Pt denies  Contacted To Inform of Risk of Harm To Self or Others: Other: Comment (No HI or SI.)    Does Patient Present under Involuntary Commitment? No    [place] of Residence: [place]   Patient Currently Receiving the Following Services: Not Receiving Services   Determination of Need: Urgent (48 hours)   Options For Referral: Facility-Based Crisis     CCA Biopsychosocial Patient Reported Schizophrenia/Schizoaffective Diagnosis in Past: No   Strengths: Pt is motivated for change   Mental Health Symptoms Depression:   Difficulty Concentrating; Change in energy/activity; Hopelessness; Sleep (too much or little)   Duration of Depressive symptoms:  Duration of Depressive Symptoms: Greater than two weeks   Mania:   None   Anxiety:    Tension; Worrying; Difficulty concentrating   Psychosis:   None   Duration of Psychotic symptoms:    Trauma:   None   Obsessions:   None   Compulsions:   None   Inattention:   None   Hyperactivity/Impulsivity:   N/A   Oppositional/Defiant Behaviors:   N/A   Emotional Irregularity:   Chronic feelings of emptiness   Other Mood/Personality Symptoms:   None    Mental Status Exam Appearance and self-care  Stature:   Average   Weight:   Thin    Clothing:   Casual   Grooming:   Neglected   Cosmetic use:   None   Posture/gait:   Normal   Motor activity:   Slowed   Sensorium  Attention:   Normal   Concentration:   Anxiety interferes   Orientation:   X5   Recall/memory:   Normal   Affect and Mood  Affect:   Blunted; Flat   Mood:   Depressed; Hopeless   Relating  Eye contact:   Normal   Facial expression:   Depressed; Sad   Attitude toward examiner:   Cooperative   Thought and Language  Speech flow:  Slow; Soft   Thought content:   Appropriate to Mood and Circumstances   Preoccupation:  None   Hallucinations:   None   Organization:   Coherent; Loose   Company secretary of Knowledge:   Average   Intelligence:   Average   Abstraction:   Normal   Judgement:   Poor   Reality Testing:   Adequate   Insight:   Fair   Decision Making:   Only simple   Social Functioning  Social Maturity:   Isolates; Impulsive   Social Judgement:   Heedless   Stress  Stressors:   Other (Comment) (Pt cannot identify any.)   Coping Ability:   Overwhelmed   Skill Deficits:   Decision making   Supports:   Family; Friends/Service system     Religion: Religion/Spirituality Are You A Religious Person?: Yes What is Your Religious Affiliation?: [employer] How Might This Affect Treatment?: No affect on treatment  Leisure/Recreation: Leisure / Recreation Do You Have Hobbies?: Yes Leisure and Hobbies: Skateboarding (got stolen 2 days ago)  Exercise/Diet: Exercise/Diet Do You Exercise?: Yes What Type of Exercise Do You Do?: Other (Comment) How Many Times a Week Do You Exercise?: 1-3 times a week Have You Gained or Lost A Significant Amount of Weight in the Past Six Months?: No Do You Follow a Special Diet?: No Do You Have Any Trouble Sleeping?: Yes Explanation of Sleeping Difficulties: Pt does not elaborate.   CCA Employment/Education Employment/Work  Situation: Employment / Work Situation Employment Situation: Unemployed Patient's Job has Been Impacted by Current Illness: No Has Patient ever Been in Equities trader?: No  Education: Education Is Patient Currently Attending School?: No Last Grade Completed: 12 Did You Product manager?: No Did You Have An Individualized Education Program (IIEP): No Did You Have Any Difficulty At [employer]?: No Patient's Education Has Been Impacted by Current Illness: No   CCA Family/Childhood History Family and Relationship History: Family history Marital status: Single Does patient have children?: No  Childhood History:  Childhood History By whom was/is the patient raised?: Grandparents, Both parents Did patient suffer any verbal/emotional/physical/sexual abuse as a child?: No Did patient suffer from severe childhood neglect?: No Has patient ever been sexually abused/assaulted/raped as an adolescent or adult?: No Was the patient ever a victim of a crime or a disaster?: No Witnessed domestic violence?: No Has patient been affected by domestic violence as an adult?: No       CCA Substance Use Alcohol/Drug Use: Alcohol / Drug Use Pain Medications: None Prescriptions: None Over the Counter: None History of alcohol / drug use?: Yes Longest period of sobriety (when/how long): N/A Negative Consequences of Use: Personal relationships Withdrawal Symptoms: Patient aware of relationship between substance abuse and physical/medical complications, Nausea / Vomiting, Fever / Chills Substance #1 Name of Substance 1: ETOH (malt liquor or wine) 1 - Age of First Use: 27 years of age 36 - Amount (size/oz): Up to a bottle 1 - Frequency: 3-4 times in a week 1 - Duration: ongoing 1 - Last Use / Amount: 11/08/22 1 - Method of Aquiring: purchase 1- Route of Use: oral Substance #2 Name of Substance 2: Marijuana 2 - Age of First Use: Teens 2 - Amount (size/oz): Reports "a puff here or there." 2 -  Frequency: Pt reports <1x/M 2 - Duration: off and on 2 - Last Use / Amount: Cannot recall 2 - Method of Aquiring: Friends 2 - Route of Substance Use: Inhalation                     ASAM's:  Six Dimensions  of Multidimensional Assessment  Dimension 1:  Acute Intoxication and/or Withdrawal Potential:   Dimension 1:  Description of individual's past and current experiences of substance use and withdrawal: Pt says he has been a social drinker in the pat but lately he has been drinking at home more.  No withdrawal others than some chills.  Also some stomach upset.  Dimension 2:  Biomedical Conditions and Complications:   Dimension 2:  Description of patient's biomedical conditions and  complications: Pt has no evident biomedical conditions  Dimension 3:  Emotional, Behavioral, or Cognitive Conditions and Complications:  Dimension 3:  Description of emotional, behavioral, or cognitive conditions and complications: Pt has depression.  Dimension 4:  Readiness to Change:  Dimension 4:  Description of Readiness to Change criteria: Pt wants to get help and change his pattern of drinking.  Dimension 5:  Relapse, Continued use, or Continued Problem Potential:     Dimension 6:  Recovery/Living Environment:     ASAM Severity Score: ASAM's Severity Rating Score: 3  ASAM Recommended Level of Treatment: ASAM Recommended Level of Treatment: Level II Intensive Outpatient Treatment   Substance use Disorder (SUD) Substance Use Disorder (SUD)  Checklist Symptoms of Substance Use: Persistent desire or unsuccessful efforts to cut down or control use, Continued use despite persistent or recurrent social, interpersonal problems, caused or exacerbated by use, Evidence of tolerance, Continued use despite having a persistent/recurrent physical/psychological problem caused/exacerbated by use, Recurrent use that results in a failure to fulfill major role obligations (work, school, home), Presence of craving or strong  urge to use, Social, occupational, recreational activities given up or reduced due to use  Recommendations for Services/Supports/Treatments: Recommendations for Services/Supports/Treatments Recommendations For Services/Supports/Treatments: Facility Based Crisis  Discharge Disposition:    DSM5 Diagnoses: Patient Active Problem List   Diagnosis Date Noted   Alcohol use disorder, severe, dependence (HCC) 11/08/2022     Referrals to Alternative Service(s): Referred to Alternative Service(s):   Place:   Date:   Time:    Referred to Alternative Service(s):   Place:   Date:   Time:    Referred to Alternative Service(s):   Place:   Date:   Time:    Referred to Alternative Service(s):   Place:   Date:   Time:     Wandra Mannan

## 2022-11-09 ENCOUNTER — Encounter (HOSPITAL_COMMUNITY): Payer: Self-pay

## 2022-11-09 DIAGNOSIS — F1020 Alcohol dependence, uncomplicated: Secondary | ICD-10-CM

## 2022-11-09 LAB — CBC WITH DIFFERENTIAL/PLATELET
Abs Immature Granulocytes: 0.02 10*3/uL (ref 0.00–0.07)
Basophils Absolute: 0.1 10*3/uL (ref 0.0–0.1)
Basophils Relative: 1 %
Eosinophils Absolute: 0.2 10*3/uL (ref 0.0–0.5)
Eosinophils Relative: 3 %
HCT: 46.9 % (ref 39.0–52.0)
Hemoglobin: 15.3 g/dL (ref 13.0–17.0)
Immature Granulocytes: 0 %
Lymphocytes Relative: 29 %
Lymphs Abs: 1.9 10*3/uL (ref 0.7–4.0)
MCH: 31.7 pg (ref 26.0–34.0)
MCHC: 32.6 g/dL (ref 30.0–36.0)
MCV: 97.3 fL (ref 80.0–100.0)
Monocytes Absolute: 0.7 10*3/uL (ref 0.1–1.0)
Monocytes Relative: 10 %
Neutro Abs: 3.7 10*3/uL (ref 1.7–7.7)
Neutrophils Relative %: 57 %
Platelets: 298 10*3/uL (ref 150–400)
RBC: 4.82 MIL/uL (ref 4.22–5.81)
RDW: 11.8 % (ref 11.5–15.5)
WBC: 6.5 10*3/uL (ref 4.0–10.5)
nRBC: 0 % (ref 0.0–0.2)

## 2022-11-09 LAB — COMPREHENSIVE METABOLIC PANEL
ALT: 14 U/L (ref 0–44)
AST: 19 U/L (ref 15–41)
Albumin: 4.4 g/dL (ref 3.5–5.0)
Alkaline Phosphatase: 70 U/L (ref 38–126)
Anion gap: 12 (ref 5–15)
BUN: 8 mg/dL (ref 6–20)
CO2: 27 mmol/L (ref 22–32)
Calcium: 9.9 mg/dL (ref 8.9–10.3)
Chloride: 100 mmol/L (ref 98–111)
Creatinine, Ser: 0.98 mg/dL (ref 0.61–1.24)
GFR, Estimated: >60 mL/min (ref >60)
Glucose, Bld: 76 mg/dL (ref 70–99)
Potassium: 4.1 mmol/L (ref 3.5–5.1)
Sodium: 139 mmol/L (ref 135–145)
Total Bilirubin: 0.7 mg/dL (ref 0.3–1.2)
Total Protein: 7.3 g/dL (ref 6.5–8.1)

## 2022-11-09 LAB — LIPID PANEL
Cholesterol: 177 mg/dL (ref 0–200)
HDL: 62 mg/dL (ref >40)
LDL Cholesterol: 92 mg/dL (ref 0–99)
Total CHOL/HDL Ratio: 2.9 RATIO
Triglycerides: 115 mg/dL (ref <150)
VLDL: 23 mg/dL (ref 0–40)

## 2022-11-09 LAB — ETHANOL: Alcohol, Ethyl (B): <10 mg/dL (ref <10)

## 2022-11-09 LAB — HEMOGLOBIN A1C
Hgb A1c MFr Bld: 4.6 % — ABNORMAL LOW (ref 4.8–5.6)
Mean Plasma Glucose: 85.32 mg/dL

## 2022-11-09 LAB — TSH: TSH: 4.205 u[IU]/mL (ref 0.350–4.500)

## 2022-11-09 NOTE — Tx Team (Signed)
LCSW met with patient to assess current mood, affect, physical state, and inquire about needs/goals while here in [facility] and after discharge. Patient reports he presented due to life stressors regarding decision making and just being overwhelmed with not sticking to his commitments. Patient reports he has been trying to stop his alcohol use and reports being clean for about 2 months and then relapsing. Patient reports he has tried to quit multiple times, however he would end up drinking thinking that he had it under control but he did not. Patient reports he lives at home with his grandparents and reports having good support from them. Patient reports he has access to his own transportation and believes his current goal is to be connected with outpatient resources for therapy and medication management as needed. Patient reports he would like to try that first before seeking a higher level of care. Patient aware that LCSW will follow up with [facility] in [place] regarding aftercare services and will follow up to provide updates as received. Patient expressed understanding and appreciation of LCSW assistance. No other needs were reported at this time by patient.    Fernande Boyden, LCSW Clinical Social Worker [facility] Ph: [phone]

## 2022-11-09 NOTE — Discharge Planning (Signed)
LCSW was stopped by patient in hallway prior to group. Patient informed this Clinician that he would like to be discharged on today and reports his plan is to follow up with his pastor to see what assistance he could provide him. Patient reports he believes he can control his drinking and reports his plan is to follow up outpatient as needed. Patient aware that [facility] walk- in hours will be provided in his AVS. Patient expressed understanding and is aware that LCSW will contact grandmother for collateral.   LCSW contacted the patient's grandmother Suan Halter [phone] per patient request. Grandmother reports there are no safety concerns with the patient discharging back home on today. Grandmother reports family will transport the patient back home on today, and reports appreciation for facility's assistance in this time. Grandmother made aware of aftercare resources provided in AVS. No other needs were reported. LCSW to sign off at this time.   Fernande Boyden, LCSW Clinical Social Worker [facility] Ph: [phone]

## 2022-11-09 NOTE — ED Provider Notes (Signed)
[facility] Discharge Summary  Date and Time: 11/09/2022 2:24 PM  Name: Steven Stein  MRN:  [id number]   Discharge Diagnoses:  Final diagnoses:  Alcohol use disorder, severe, dependence (HCC)  Mild episode of recurrent major depressive disorder (HCC)  Cigarette smoker    Subjective: Seen again today as patient requesting discharge. Patient feels that now he has resources for AA that he will just follow up outpatient. No acute questions at this time. He does not want to do residential treatment at this time.   Stay Summary by Problem List Steven Stein is a 27 y.o. male with no known past psychiatric history [facility] alcohol detox.   Alcohol Use Disorder Patient has relapsed in alcohol this month by drinking 1 can of beer to 1 bottle of wine daily. He had a 2 month period of sobriety prior to this. While this was primary reason for patient's admission, patient has elected to discharge with resources as he states he has received the resources he needed to continue his plan for sobriety when he discharged. Patient not acutely withdrawing based on CIWA score. UDS positive for amphetamine but patient denies any substance use beyond smoking 2 cigarettes in a month and daily alcohol use.     Total Time spent with patient: 45 minutes  Past Psychiatric History: none Past Medical History: VSD s/p closure Past Surgical History:  Procedure Laterality Date   CARDIAC SURGERY     Family History: unknown Family Psychiatric History: none Social History:  Social History   Substance and Sexual Activity  Alcohol Use Yes     Social History   Substance and Sexual Activity  Drug Use Yes   Types: Marijuana   Comment: last smoked today    Social History   Socioeconomic History   Marital status: Single    Spouse name: Not on file   Number of children: Not on file   Years of education: Not on file   Highest education level: Not on file  Occupational History    Not on file  Tobacco Use   Smoking status: Some Days   Smokeless tobacco: Never  Substance and Sexual Activity   Alcohol use: Yes   Drug use: Yes    Types: Marijuana    Comment: last smoked today   Sexual activity: Not on file  Other Topics Concern   Not on file  Social History Narrative   Not on file   Social Determinants of Health   Financial Resource Strain: Not on file  Food Insecurity: Not on file  Transportation Needs: Not on file  Physical Activity: Not on file  Stress: Not on file  Social Connections: Not on file   SDOH:  SDOH Screenings   Depression (PHQ2-9): Low Risk  (11/08/2022)  Tobacco Use: High Risk (07/20/2022)    Tobacco Cessation:  N/A, patient does not currently use tobacco products  Current Medications:  Current Facility-Administered Medications  Medication Dose Route Frequency Provider Last Rate Last Admin   acetaminophen (TYLENOL) tablet 650 mg  650 mg Oral Q6H PRN Onuoha, Chinwendu V, NP       alum & mag hydroxide-simeth (MAALOX/MYLANTA) 200-200-20 MG/5ML suspension 30 mL  30 mL Oral Q4H PRN Onuoha, Chinwendu V, NP       hydrOXYzine (ATARAX) tablet 25 mg  25 mg Oral Q6H PRN Onuoha, Chinwendu V, NP   25 mg at 11/08/22 2342   loperamide (IMODIUM) capsule 2-4 mg  2-4 mg Oral PRN Onuoha, Chinwendu  V, NP       LORazepam (ATIVAN) tablet 1 mg  1 mg Oral Q6H PRN Onuoha, Chinwendu V, NP       magnesium hydroxide (MILK OF MAGNESIA) suspension 30 mL  30 mL Oral Daily PRN Onuoha, Chinwendu V, NP       multivitamin with minerals tablet 1 tablet  1 tablet Oral Daily Onuoha, Chinwendu V, NP   1 tablet at 11/09/22 1010   ondansetron (ZOFRAN-ODT) disintegrating tablet 4 mg  4 mg Oral Q6H PRN Onuoha, Chinwendu V, NP       thiamine (VITAMIN B1) tablet 100 mg  100 mg Oral Daily Onuoha, Chinwendu V, NP   100 mg at 11/09/22 1011   No current outpatient medications on file.    PTA Medications: (Not in a hospital admission)       11/09/2022    2:23 PM 11/08/2022    10:52 PM  Depression screen PHQ 2/9  Decreased Interest 0 0  Down, Depressed, Hopeless 1 1  PHQ - 2 Score 1 1    Flowsheet Row ED from 11/08/2022 in [facility] ED from 07/20/2022 in [facility] Department at [facility] ED from 02/18/2022 in [facility] Emergency Department at [facility]  C-SSRS RISK CATEGORY No Risk No Risk No Risk       Musculoskeletal  Strength & Muscle Tone: within normal limits Gait & Station: normal Patient leans: N/A  Psychiatric Specialty Exam  Presentation  General Appearance:  Casual   Eye Contact: Fair   Speech: Clear and Coherent   Speech Volume: Normal   Handedness: Right    Mood and Affect  Mood: Depressed; Hopeless   Affect: Congruent; Flat    Thought Process  Thought Processes: Coherent   Descriptions of Associations:Intact   Orientation:Full (Time, Place and Person)   Thought Content:WDL      Hallucinations:Hallucinations: None   Ideas of Reference:None   Suicidal Thoughts:Suicidal Thoughts: No   Homicidal Thoughts:Homicidal Thoughts: No    Sensorium  Memory: Immediate Fair   Judgment: Poor   Insight: Poor    Executive Functions  Concentration: Fair   Attention Span: Good   Recall: Fair   Fund of Knowledge: Good   Language: Good    Psychomotor Activity  Psychomotor Activity: Psychomotor Activity: Normal    Assets  Assets: Communication Skills; Desire for Improvement    Sleep  Sleep: Sleep: Fair    Nutritional Assessment (For OBS and FBC admissions only) Has the patient had a weight loss or gain of 10 pounds or more in the last 3 months?: No Has the patient had a decrease in food intake/or appetite?: No Does the patient have dental problems?: No Does the patient have eating habits or behaviors that may be indicators of an eating disorder including binging or inducing vomiting?: No Has the patient  recently lost weight without trying?: 0 Has the patient been eating poorly because of a decreased appetite?: 0 Malnutrition Screening Tool Score: 0     Physical Exam  Physical Exam Vitals and nursing note reviewed.  Constitutional:      General: He is not in acute distress.    Appearance: He is well-developed.  HENT:     Head: Normocephalic and atraumatic.  Eyes:     Conjunctiva/sclera: Conjunctivae normal.  Cardiovascular:     Rate and Rhythm: Normal rate and regular rhythm.     Heart sounds: No murmur heard. Pulmonary:     Effort: Pulmonary effort  is normal. No respiratory distress.     Breath sounds: Normal breath sounds.  Abdominal:     Palpations: Abdomen is soft.     Tenderness: There is no abdominal tenderness.  Musculoskeletal:        General: No swelling.     Cervical back: Neck supple.  Skin:    General: Skin is warm and dry.     Capillary Refill: Capillary refill takes less than 2 seconds.  Neurological:     Mental Status: He is alert.  Psychiatric:        Mood and Affect: Mood normal.    Review of Systems  Constitutional:  Negative for chills and fever.  Respiratory:  Negative for cough, shortness of breath and wheezing.   Cardiovascular:  Negative for chest pain and palpitations.  Gastrointestinal:  Negative for abdominal pain, constipation, diarrhea, heartburn, nausea and vomiting.  Skin:  Negative for itching and rash.  Neurological:  Negative for dizziness and headaches.   Blood pressure (!) 149/98, pulse (!) 48, temperature 98 F (36.7 C), temperature source Tympanic, resp. rate 18, SpO2 100 %. There is no height or weight on file to calculate BMI.  Demographic Factors:  Adolescent or young adult  Loss Factors: NA  Historical Factors: Impulsivity  Risk Reduction Factors:   Living with another person, especially a relative, Positive social support, Positive therapeutic relationship, and Positive coping skills or problem solving  skills  Continued Clinical Symptoms:  Alcohol/Substance Abuse/Dependencies  Cognitive Features That Contribute To Risk:  None    Suicide Risk:  Mild:  There are no identifiable plans, no associated intent, mild dysphoria and related symptoms, good self-control (both objective and subjective assessment), few other risk factors, and identifiable protective factors, including available and accessible social support.  Plan Of Care/Follow-up recommendations:  Recommended patient follow up with AA and provided other substance use resources  Park Pope, MD 11/09/2022, 2:24 PM

## 2022-11-09 NOTE — Discharge Instructions (Addendum)
[facility] Address: [address] [place], [place] [address] Phone: [phone] Walk-in hours are M-F from 8am-2:00pm.  SUBSTANCE USE TREATMENT for Medicaid and State Funded/IPRS  [facility] [address] [place], [place], [address] [phone] phone NOTE: [facility] is no longer offering IOP services.  Serves those who are low-income or have no insurance.  [facility] [address], [place], [place], [address] [phone] phone [phone] fax NOTE: Does have Substance Abuse-Intensive Outpatient Program [facility]) as well as transitional housing if eligible.  [facility] [address] [place], [place], [address] [phone] phone [phone] fax  [facility] [phone] [place], [place], [address] [phone] phone [phone] fax  HALFWAY HOUSES:  [facility] [phone]  [employer].oxfordvacancies.com  12 STEP PROGRAMS:  Alcoholics Anonymous of [place] [employer]alet.be  Narcotics Anonymous of [place] [employer]dk  Al-Anon of [place], [place] www.[place]alanon.org/find-meetings.html  Nar-Anon https://nar-anon.org/find-a-meetin  List of Residential placements:   [facility] in [place]: [phone]  [facility]: [phone]  [place], [place] [phone]: Male and male facility; 30-day program: (uninsured and Medicaid such as Laurena Bering, [place], [place], partners)  [facility]: [phone]; men and women's facility; 28 days; Can have Medicaid tailored plan Tour manager or Partners)  [facility]: [phone] Karoline Caldwell or Larita Fife; 28 day program; must be fully detox; tailored Medicaid or no insurance  [address] in [place], [place]; [phone]; 28 day all males program; no insurance accepted  [facility] Referral in [employer]: Gabriel Rung [phone] (no  insurance or Medicaid only); 90 days; outpatient services but provide housing in apartments [place]  [facility] Admission: [phone]: Patient must complete phone screening for placement: [place], [place]; 6 month program; uninsured, Medicaid, and [employer].   [facility]: no insurance required; [phone]  [facility]: [phone]; Intake: Molly Maduro; Must fill out application online; Alecia Lemming Delay [phone] x [phone] [facility] in [place], [place]: [phone]; Admissions Coordinators Mr. Maurine Minister or Barron Alvine; 90 day program.  [facility]: [place], [place] [phone]; Co-Ed 9 month to a year program; Online application; Men entry fee is $500 (6-93months);  [employer]: [address] [place], [place] [address]; no fee or insurance required; minimum of 2 years; Highly structured; work based; Intake Coordinator is Thayer Ohm [phone]  [facility] in [place], [place]: [phone]; Fax number is [phone]; website: www.[facility].org; Requires 3-6 page autobiography; 2 year program (18 months and then 65month transitional housing); Admission fee is $300; no insurance needed; work Automotive engineer in [place], [place]: [employer] Desk Staff: Danise Edge [phone]: They have a Men's Regenerations Program 6-25months. Free program; There is an initial $300 fee however, they are willing to work with patients regarding that. Application is online.  [facility]: Admissions [phone] Doran Heater ext [phone]; Any 7-90 day program is out of pocket; 12 month program is free of charge; there is a $275 entry fee; Patient is responsible for own transportation

## 2022-11-09 NOTE — ED Notes (Signed)
Pt admitted to [facility] endorsing ETOH abuse and depression. Patient AOx4 ON ARRIVAL. Patient was cooperative during the admission assessment. Skin assessment complete. Belongings inventoried. Patient oriented to unit and unit rules. Meal and drinks offered to patient.  Patient verbalized agreement to treatment plans. Patient verbally contracts for safety while hospitalized. Will monitor for safety.

## 2022-11-09 NOTE — BH IP Treatment Plan (Signed)
Interdisciplinary Treatment and Diagnostic Plan Update  11/09/2022 Time of Session: 10:15AM Steven Stein MRN: [id number]  Diagnosis:  Final diagnoses:  Alcohol use disorder, severe, dependence (HCC)  Mild episode of recurrent major depressive disorder (HCC)  Cigarette smoker     Current Medications:  Current Facility-Administered Medications  Medication Dose Route Frequency Provider Last Rate Last Admin   acetaminophen (TYLENOL) tablet 650 mg  650 mg Oral Q6H PRN Onuoha, Chinwendu V, NP       alum & mag hydroxide-simeth (MAALOX/MYLANTA) 200-200-20 MG/5ML suspension 30 mL  30 mL Oral Q4H PRN Onuoha, Chinwendu V, NP       hydrOXYzine (ATARAX) tablet 25 mg  25 mg Oral Q6H PRN Onuoha, Chinwendu V, NP   25 mg at 11/08/22 2342   loperamide (IMODIUM) capsule 2-4 mg  2-4 mg Oral PRN Onuoha, Chinwendu V, NP       LORazepam (ATIVAN) tablet 1 mg  1 mg Oral Q6H PRN Onuoha, Chinwendu V, NP       magnesium hydroxide (MILK OF MAGNESIA) suspension 30 mL  30 mL Oral Daily PRN Onuoha, Chinwendu V, NP       multivitamin with minerals tablet 1 tablet  1 tablet Oral Daily Onuoha, Chinwendu V, NP   1 tablet at 11/09/22 1010   ondansetron (ZOFRAN-ODT) disintegrating tablet 4 mg  4 mg Oral Q6H PRN Onuoha, Chinwendu V, NP       thiamine (VITAMIN B1) tablet 100 mg  100 mg Oral Daily Onuoha, Chinwendu V, NP   100 mg at 11/09/22 1011   Current Outpatient Medications  Medication Sig Dispense Refill   meloxicam (MOBIC) 15 MG tablet Take 1 tablet (15 mg total) by mouth daily. 30 tablet 2   PTA Medications: Prior to Admission medications   Medication Sig Start Date End Date Taking? Authorizing Provider  meloxicam (MOBIC) 15 MG tablet Take 1 tablet (15 mg total) by mouth daily. 02/18/22 02/18/23  Faythe Ghee, PA-C    Patient Stressors: Other: Patient reports issues with decision making and substance use    Patient Strengths: Ability for insight  Capable of independent living  Communication skills   General fund of knowledge  Motivation for treatment/growth  Supportive family/friends   Treatment Modalities: Medication Management, Group therapy, Case management,  1 to 1 session with clinician, Psychoeducation, Recreational therapy.   Physician Treatment Plan for Primary and Secondary Diagnosis:  Final diagnoses:  Alcohol use disorder, severe, dependence (HCC)  Mild episode of recurrent major depressive disorder (HCC)  Cigarette smoker   Long Term Goal(s): Improvement in symptoms so as ready for discharge  Short Term Goals: Patient will verbalize feelings in meetings with treatment team members. Patient will attend at least of 50% of the groups daily. Pt will complete the PHQ9 on admission, day 3 and discharge. Patient will participate in completing the [place] Suicide Severity Rating Scale Patient will score a low risk of violence for 24 hours prior to discharge Patient will take medications as prescribed daily.  Medication Management: Evaluate patient's response, side effects, and tolerance of medication regimen.  Therapeutic Interventions: 1 to 1 sessions, Unit Group sessions and Medication administration.  Evaluation of Outcomes: Progressing  LCSW Treatment Plan for Primary Diagnosis:  Final diagnoses:  Alcohol use disorder, severe, dependence (HCC)  Mild episode of recurrent major depressive disorder (HCC)  Cigarette smoker    Long Term Goal(s): Safe transition to appropriate next level of care at discharge.  Short Term Goals: Facilitate acceptance of mental health diagnosis and  concerns through verbal commitment to aftercare plan and appointments at discharge., Patient will identify one social support prior to discharge to aid in patient's recovery., Patient will attend AA/NA groups as scheduled., Identify minimum of 2 triggers associated with mental health/substance abuse issues with treatment team members., and Increase skills for wellness and recovery by attending  50% of scheduled groups.  Therapeutic Interventions: Assess for all discharge needs, 1 to 1 time with Child psychotherapist, Explore available resources and support systems, Assess for adequacy in community support network, Educate family and significant other(s) on suicide prevention, Complete Psychosocial Assessment, Interpersonal group therapy.  Evaluation of Outcomes: Progressing   Progress in Treatment: Attending groups: Yes. Participating in groups: Yes. Taking medication as prescribed: Yes. Toleration medication: Yes. Family/Significant other contact made: Yes, individual(s) contacted:  Patient provided LCSW with permission to contact with grandmother for collateral.  Patient understands diagnosis: Yes. Discussing patient identified problems/goals with staff: Yes. Medical problems stabilized or resolved: Yes. Denies suicidal/homicidal ideation: Yes. Issues/concerns per patient self-inventory: No.  New problem(s) identified: No, Describe:  other than reported on admission  New Short Term/Long Term Goal(s): Safe transition to appropriate next level of care at discharge, Engage patient in therapeutic group addressing interpersonal concerns. Engage patient in aftercare planning with referrals and resources, Increase ability to appropriately verbalize feelings, Facilitate acceptance of mental health diagnosis and concerns and Identify triggers associated with mental health/substance abuse issues.    Patient Goals: Patient reports he would like to return home with his grandparents in about 2-3 days. Patient reports an interest in being connected with an outpatient therapist for further treatment regarding his alcoholism.   Discharge Plan or Barriers: LCSW will provide information on outpatient resources for alcohol. Patient will be provided the AA group information and be scheduled for an aftercare appointment within 3-7 days of discharge.  Reason for Continuation of Hospitalization: Withdrawal  symptoms  Estimated Length of Stay: 3-5 days  Last 3 [place] Suicide Severity Risk Score: Flowsheet Row ED from 11/08/2022 in [facility] ED from 07/20/2022 in [facility] Emergency Department at [facility] ED from 02/18/2022 in [facility] Emergency Department at [facility]  C-SSRS RISK CATEGORY No Risk No Risk No Risk       Last [facility] 2/9 Scores:    11/08/2022   10:52 PM  Depression screen PHQ 2/9  Decreased Interest 0  Down, Depressed, Hopeless 1  PHQ - 2 Score 1    Scribe for Treatment Team: Lenny Pastel 11/09/2022 11:23 AM

## 2022-11-09 NOTE — ED Notes (Signed)
Pt is in the bed sleeping. Respirations are even and unlabored. No acute distress noted. Will continue to monitor for safety. 

## 2022-11-09 NOTE — ED Provider Notes (Cosign Needed Addendum)
FBC Progress Note  Date and Time: 11/09/2022 11:29 AM Name: Steven Stein MRN:  [id number]  Reason For Admission: Alcohol detox  Subjective: Patient seen and assessed at bedside.  Denies SI/HI/AVH.  He reports wanting to discontinue all alcohol use.  He reports very range of alcohol use daily ranging from a can of beer to a bottle of wine.  He reports this is significantly less than when he was drinking earlier this year.  He did have a period of abstinence for approximately 2 months but relapsed in May.  He reported that he needed help to "commit" to discontinuing all his alcohol use.  He denies any significant withdrawal symptoms during his 2 months of sobriety earlier this year.  He reports interest in outpatient treatment at this time as he reports he is living with grandparents and supporting them around the house.  Diagnosis:  Final diagnoses:  Alcohol use disorder, severe, dependence (HCC)  Mild episode of recurrent major depressive disorder (HCC)  Cigarette smoker    Total Time spent with patient: 45 minutes   Labs  Lab Results:     Latest Ref Rng & Units 11/08/2022   11:15 PM 02/18/2022   12:51 AM  CBC  WBC 4.0 - 10.5 K/uL 6.5  8.0   Hemoglobin 13.0 - 17.0 g/dL 13.2  44.0   Hematocrit 39.0 - 52.0 % 46.9  44.7   Platelets 150 - 400 K/uL 298  307       Latest Ref Rng & Units 11/08/2022   11:15 PM 02/18/2022   12:51 AM  CMP  Glucose 70 - 99 mg/dL 76  90   BUN 6 - 20 mg/dL 8  16   Creatinine 1.02 - 1.24 mg/dL 7.25  3.66   Sodium 440 - 145 mmol/L 139  138   Potassium 3.5 - 5.1 mmol/L 4.1  3.8   Chloride 98 - 111 mmol/L 100  103   CO2 22 - 32 mmol/L 27  25   Calcium 8.9 - 10.3 mg/dL 9.9  9.3   Total Protein 6.5 - 8.1 g/dL 7.3    Total Bilirubin 0.3 - 1.2 mg/dL 0.7    Alkaline Phos 38 - 126 U/L 70    AST 15 - 41 U/L 19    ALT 0 - 44 U/L 14      Physical Findings   PHQ2-9    Flowsheet Row ED from 11/08/2022 in [facility]  PHQ-2 Total  Score 1      Flowsheet Row ED from 11/08/2022 in [facility] ED from 07/20/2022 in [facility] Emergency Department at [facility] ED from 02/18/2022 in [facility] Emergency Department at [facility]  C-SSRS RISK CATEGORY No Risk No Risk No Risk        Musculoskeletal  Strength & Muscle Tone: within normal limits Gait & Station: normal Patient leans: N/A  Psychiatric Specialty Exam  Presentation  General Appearance:  Casual   Eye Contact: Fair   Speech: Clear and Coherent   Speech Volume: Normal   Handedness: Right    Mood and Affect  Mood: Depressed; Hopeless   Affect: Congruent; Flat    Thought Process  Thought Processes: Coherent   Descriptions of Associations:Intact   Orientation:Full (Time, Place and Person)   Thought Content:WDL   Diagnosis of Schizophrenia or Schizoaffective disorder in past: No     Hallucinations:Hallucinations: None   Ideas of Reference:None   Suicidal Thoughts:Suicidal Thoughts: No  Homicidal Thoughts:Homicidal Thoughts: No    Sensorium  Memory: Immediate Fair   Judgment: Poor   Insight: Poor    Executive Functions  Concentration: Fair   Attention Span: Good   Recall: Fair   Fund of Knowledge: Good   Language: Good    Psychomotor Activity  Psychomotor Activity: Psychomotor Activity: Normal    Assets  Assets: Communication Skills; Desire for Improvement    Sleep  Sleep: Sleep: Fair    Physical Exam  Physical Exam Vitals and nursing note reviewed.  Constitutional:      General: He is not in acute distress.    Appearance: He is well-developed.  HENT:     Head: Normocephalic and atraumatic.  Eyes:     Conjunctiva/sclera: Conjunctivae normal.  Cardiovascular:     Rate and Rhythm: Normal rate and regular rhythm.     Heart sounds: No murmur heard. Pulmonary:     Effort: Pulmonary effort is normal. No respiratory  distress.     Breath sounds: Normal breath sounds.  Abdominal:     Palpations: Abdomen is soft.     Tenderness: There is no abdominal tenderness.  Musculoskeletal:        General: No swelling.     Cervical back: Neck supple.  Skin:    General: Skin is warm and dry.     Capillary Refill: Capillary refill takes less than 2 seconds.  Neurological:     Mental Status: He is alert.  Psychiatric:        Mood and Affect: Mood normal.   Review of Systems  Respiratory:  Negative for shortness of breath.   Cardiovascular:  Negative for chest pain.  Gastrointestinal:  Negative for abdominal pain, constipation, diarrhea, heartburn, nausea and vomiting.  Neurological:  Negative for headaches.   Blood pressure (!) 149/98, pulse (!) 48, temperature 98 F (36.7 C), temperature source Tympanic, resp. rate 18, SpO2 100 %. There is no height or weight on file to calculate BMI.  ASSESSMENT Steven Stein is a 27 y.o. male with no known past psychiatric history [facility] the facility base crisis for alcohol detox.  PLAN Alcohol use disorder -CIWA w/ PRN ativan -MVI/Thiamine -Hydroxyzine 25 mg tid prn for anxiety -Consider naltrexone if patient amenable  Dispo TBD   Park Pope, MD 11/09/2022 11:29 AM

## 2022-11-09 NOTE — ED Notes (Signed)
Received patient this AM. Patient sitting in day room. Patient respirations are even and unlabored. Will continue to monitor for safety.

## 2022-11-09 NOTE — Group Note (Signed)
Group Topic: Balance in Life  Group Date: 11/09/2022 Start Time: 0130 End Time: 0221 Facilitators: Lenny Pastel  Department: [facility]  Number of Participants: 8  Group Focus: affirmation, clarity of thought, communication, daily focus, feeling awareness/expression, goals/reality orientation, personal responsibility, problem solving, relapse prevention, self-awareness, social skills, and substance abuse education Treatment Modality:  Cognitive Behavioral Therapy Interventions utilized were exploration, mental fitness, and support Purpose: enhance coping skills, explore maladaptive thinking, express feelings, express irrational fears, improve communication skills, increase insight, regain self-worth, reinforce self-care, relapse prevention strategies, and trigger / craving management  Name: Steven Stein Date of Birth: June 17, 1996  MR: [id number]    Level of Participation: Patient did not attend; Patient informed LCSW that he was discharging and wanted to go home.   Patients Problems:  Patient Active Problem List   Diagnosis Date Noted   Alcohol use disorder, severe, dependence (HCC) 11/08/2022

## 2022-11-09 NOTE — Group Note (Signed)
Group Topic: Positive Affirmations  Group Date: 11/09/2022 Start Time: 1100 End Time: 1215 Facilitators: Vonzell Schlatter B  Department: [facility]  Number of Participants: 7  Group Focus: activities of daily living skills and daily focus Treatment Modality:  Psychoeducation Interventions utilized were mental fitness Purpose: Self Discipline  Name: Steven Stein Date of Birth: 1996/04/14  MR: [id number]    Level of Participation: active Quality of Participation: attentive and cooperative Interactions with others: gave feedback and intrusive Mood/Affect: positive Triggers (if applicable): na Cognition: coherent/clear Progress: Moderate Response: na Plan: follow-up needed  Patients Problems:  Patient Active Problem List   Diagnosis Date Noted   Alcohol use disorder, severe, dependence (HCC) 11/08/2022

## 2022-11-09 NOTE — ED Provider Notes (Incomplete)
Facility Based Crisis Admission H&P  Date: 11/08/22 Patient Name: Steven Stein MRN: [id number] Chief Complaint: ***  Diagnoses:  Final diagnoses:  Alcohol use disorder, severe, dependence (HCC)  Mild episode of recurrent major depressive disorder (HCC)    HPI: ***  PHQ 2-9:   Flowsheet Row ED from 11/08/2022 in [facility] ED from 07/20/2022 in [facility] Emergency Department at [facility] ED from 02/18/2022 in [facility] Emergency Department at [facility]  C-SSRS RISK CATEGORY No Risk No Risk No Risk         Total Time spent with patient: {Time; 15 min - 8 hours:17441}  Musculoskeletal  Strength & Muscle Tone: {desc; muscle tone:32375} Gait & Station: {PE GAIT ED WUJW:11914} Patient leans: {Patient Leans:21022755}  Psychiatric Specialty Exam  Presentation General Appearance:  Casual  Eye Contact: Fair  Speech: Clear and Coherent  Speech Volume: Normal  Handedness: Right   Mood and Affect  Mood: Depressed; Hopeless  Affect: Congruent; Flat   Thought Process  Thought Processes: Coherent  Descriptions of Associations:Intact  Orientation:Full (Time, Place and Person)  Thought Content:WDL    Hallucinations:Hallucinations: None  Ideas of Reference:None  Suicidal Thoughts:Suicidal Thoughts: No  Homicidal Thoughts:Homicidal Thoughts: No   Sensorium  Memory: Immediate Fair  Judgment: Poor  Insight: Poor   Executive Functions  Concentration: Fair  Attention Span: Good  Recall: Fair  Fund of Knowledge: Good  Language: Good   Psychomotor Activity  Psychomotor Activity: Psychomotor Activity: Normal   Assets  Assets: Communication Skills; Desire for Improvement   Sleep  Sleep: Sleep: Fair   Nutritional Assessment (For OBS and FBC admissions only) Has the patient had a weight loss or gain of 10 pounds or more in the last 3 months?: No Has the patient had a decrease  in food intake/or appetite?: No Does the patient have dental problems?: No Does the patient have eating habits or behaviors that may be indicators of an eating disorder including binging or inducing vomiting?: No Has the patient recently lost weight without trying?: 0 Has the patient been eating poorly because of a decreased appetite?: 0 Malnutrition Screening Tool Score: 0    Physical Exam Constitutional:      General: He is not in acute distress.    Appearance: He is not diaphoretic.  HENT:     Head: Normocephalic.     Right Ear: External ear normal.     Left Ear: External ear normal.  Neurological:     Mental Status: He is alert.    ROS  Blood pressure (!) 155/91, pulse (!) 48, temperature 98.6 F (37 C), temperature source Oral, resp. rate 18, SpO2 100 %. There is no height or weight on file to calculate BMI.  Past Psychiatric History: ***   Is the patient at risk to self? {YES/NO:21197} Has the patient been a risk to self in the past 6 months? {YES/NO:21197}.    Has the patient been a risk to self within the distant past? {YES/NO:21197}  Is the patient a risk to others? {YES/NO:21197}  Has the patient been a risk to others in the past 6 months? {YES/NO:21197}  Has the patient been a risk to others within the distant past? {YES/NO:21197}  Past Medical History: *** Family History: *** Social History: ***  Last Labs:  No visits with results within 6 Month(s) from this visit.  Latest known visit with results is:  Admission on 02/18/2022, Discharged on 02/18/2022  Component Date Value Ref Range Status  .  Sodium 02/18/2022 138  135 - 145 mmol/L Final  . Potassium 02/18/2022 3.8  3.5 - 5.1 mmol/L Final  . Chloride 02/18/2022 103  98 - 111 mmol/L Final  . CO2 02/18/2022 25  22 - 32 mmol/L Final  . Glucose, Bld 02/18/2022 90  70 - 99 mg/dL Final   Glucose reference range applies only to samples taken after fasting for at least 8 hours.  . BUN 02/18/2022 16  6 - 20 mg/dL  Final  . Creatinine, Ser 02/18/2022 0.98  0.61 - 1.24 mg/dL Final  . Calcium 77/82/4235 9.3  8.9 - 10.3 mg/dL Final  . GFR, Estimated 02/18/2022 >60  >60 mL/min Final   Comment: (NOTE) Calculated using the CKD-EPI Creatinine Equation (2021)   . Anion gap 02/18/2022 10  5 - 15 Final   Performed at [facility], [address] [place] [place], [place] [address]  . WBC 02/18/2022 8.0  4.0 - 10.5 K/uL Final  . RBC 02/18/2022 4.62  4.22 - 5.81 MIL/uL Final  . Hemoglobin 02/18/2022 14.5  13.0 - 17.0 g/dL Final  . HCT 31/54/0086 44.7  39.0 - 52.0 % Final  . MCV 02/18/2022 96.8  80.0 - 100.0 fL Final  . MCH 02/18/2022 31.4  26.0 - 34.0 pg Final  . MCHC 02/18/2022 32.4  30.0 - 36.0 g/dL Final  . RDW 76/19/5093 12.1  11.5 - 15.5 % Final  . Platelets 02/18/2022 307  150 - 400 K/uL Final  . nRBC 02/18/2022 0.0  0.0 - 0.2 % Final   Performed at [facility], [address], [place], [place] [address]  . Troponin I (High Sensitivity) 02/18/2022 3  <18 ng/L Final   Comment: (NOTE) Elevated high sensitivity troponin I (hsTnI) values and significant  changes across serial measurements may suggest ACS but many other  chronic and acute conditions are known to elevate hsTnI results.  Refer to the "Links" section for chest pain algorithms and additional  guidance. Performed at [facility], [address], [place], [place] [address]   . Troponin I (High Sensitivity) 02/18/2022 5  <18 ng/L Final   Comment: (NOTE) Elevated high sensitivity troponin I (hsTnI) values and significant  changes across serial measurements may suggest ACS but many other  chronic and acute conditions are known to elevate hsTnI results.  Refer to the "Links" section for chest pain algorithms and additional  guidance. Performed at [facility], [address], [place], [place] [address]     Allergies: Patient has no known allergies.  Medications:  PTA Medications  Medication Sig  .  meloxicam (MOBIC) 15 MG tablet Take 1 tablet (15 mg total) by mouth daily.    Long Term Goals: {[facility] MD Tx Plan Long Term Goals:30414007::"Improvement in symptoms so as ready for discharge"}  Short Term Goals: {FBC MD Tx Plan Short Term Goals:27589}  Medical Decision Making  ***    Recommendations  [facility] MSE Recommendations:304701}  Mancel Bale, NP 11/08/22  10:52 PM

## 2022-11-22 ENCOUNTER — Encounter: Payer: Self-pay | Admitting: Emergency Medicine

## 2022-11-22 ENCOUNTER — Emergency Department
Admission: EM | Admit: 2022-11-22 | Discharge: 2022-11-22 | Disposition: A | Discharge status: Home / Self Care | Payer: 59 | Attending: Emergency Medicine | Admitting: Emergency Medicine

## 2022-11-22 ENCOUNTER — Other Ambulatory Visit: Payer: Self-pay

## 2022-11-22 DIAGNOSIS — R21 Rash and other nonspecific skin eruption: Secondary | ICD-10-CM | POA: Diagnosis present

## 2022-11-22 DIAGNOSIS — Y92410 Unspecified street and highway as the place of occurrence of the external cause: Secondary | ICD-10-CM | POA: Diagnosis not present
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
## 2022-11-22 NOTE — Discharge Instructions (Signed)
Please return for any new, worsening, or change in symptoms or other concerns.  It was a pleasure caring for you today. 

## 2023-10-02 ENCOUNTER — Emergency Department
Admission: EM | Admit: 2023-10-02 | Discharge: 2023-10-03 | Disposition: A | Discharge status: Home / Self Care | Payer: Self-pay | Attending: Emergency Medicine | Admitting: Emergency Medicine

## 2023-10-02 ENCOUNTER — Other Ambulatory Visit: Payer: Self-pay

## 2023-10-02 DIAGNOSIS — K08109 Complete loss of teeth, unspecified cause, unspecified class: Secondary | ICD-10-CM | POA: Diagnosis not present

## 2023-10-02 DIAGNOSIS — K047 Periapical abscess without sinus: Secondary | ICD-10-CM | POA: Insufficient documentation

## 2023-10-02 DIAGNOSIS — K0889 Other specified disorders of teeth and supporting structures: Secondary | ICD-10-CM | POA: Diagnosis present

## 2023-10-02 LAB — CBC
HCT: 44.2 % (ref 39.0–52.0)
Hemoglobin: 14.7 g/dL (ref 13.0–17.0)
MCH: 31.6 pg (ref 26.0–34.0)
MCHC: 33.3 g/dL (ref 30.0–36.0)
MCV: 95.1 fL (ref 80.0–100.0)
Platelets: 293 10*3/uL (ref 150–400)
RBC: 4.65 MIL/uL (ref 4.22–5.81)
RDW: 11.4 % — ABNORMAL LOW (ref 11.5–15.5)
WBC: 9.4 10*3/uL (ref 4.0–10.5)
nRBC: 0 % (ref 0.0–0.2)

## 2023-10-02 LAB — BASIC METABOLIC PANEL WITH GFR
Anion gap: 11 (ref 5–15)
BUN: 14 mg/dL (ref 6–20)
CO2: 29 mmol/L (ref 22–32)
Calcium: 9.7 mg/dL (ref 8.9–10.3)
Chloride: 98 mmol/L (ref 98–111)
Creatinine, Ser: 0.97 mg/dL (ref 0.61–1.24)
GFR, Estimated: >60 mL/min (ref >60)
Glucose, Bld: 70 mg/dL (ref 70–99)
Potassium: 4 mmol/L (ref 3.5–5.1)
Sodium: 138 mmol/L (ref 135–145)

## 2023-10-02 NOTE — ED Triage Notes (Signed)
 Pt to ED via POV c/o dental pain. Pt has pain and swelling to lower left mouth. Pt reports going to dentist on the 10th and got xrays done and was prescribed antibiotics. Pt says that pain and swelling has been getting worse.

## 2023-10-03 MED ORDER — AMOXICILLIN-POT CLAVULANATE 875-125 MG PO TABS: 1.0000 | ORAL_TABLET | Freq: Two times a day (BID) | ORAL | 0 refills | Status: AC

## 2023-10-03 MED ORDER — KETOROLAC TROMETHAMINE 30 MG/ML IJ SOLN
30.0000 mg | Freq: Once | INTRAMUSCULAR | Status: AC
  Administered 2023-10-03: 30 mg via INTRAMUSCULAR
  Filled 2023-10-03: qty 1

## 2023-10-03 MED ORDER — LIDOCAINE-EPINEPHRINE 1 %-1:100000 IJ SOLN
20.0000 mL | Freq: Once | INTRAMUSCULAR | Status: AC
  Administered 2023-10-03: 20 mL
  Filled 2023-10-03: qty 1
  Filled 2023-10-03: qty 20

## 2023-10-03 MED ORDER — DEXAMETHASONE SODIUM PHOSPHATE 10 MG/ML IJ SOLN
10.0000 mg | Freq: Once | INTRAMUSCULAR | Status: AC
  Administered 2023-10-03: 10 mg via INTRAMUSCULAR
  Filled 2023-10-03: qty 1

## 2023-10-03 MED ORDER — AMOXICILLIN-POT CLAVULANATE 875-125 MG PO TABS
1.0000 | ORAL_TABLET | Freq: Once | ORAL | Status: AC
  Administered 2023-10-03: 1 via ORAL
  Filled 2023-10-03: qty 1

## 2023-10-03 NOTE — Discharge Instructions (Addendum)
 Take antibiotics as prescribed and see your dentist in the morning for reevaluation.  Take acetaminophen 650 mg and ibuprofen 400 mg every 6 hours for pain.  Take with food. Thank you for choosing us  for your health care today!  Please see your primary doctor this week for a follow up appointment.   If you have any new, worsening, or unexpected symptoms call your doctor right away or come back to the emergency department for reevaluation.  It was my pleasure to care for you today.   Arron Large Margery Sheets, MD

## 2023-10-03 NOTE — ED Provider Notes (Signed)
 [facility] Provider Note    Event Date/Time   First MD Initiated Contact with Patient 10/02/23 2302     (approximate)   History   Dental Pain and Oral Swelling   HPI  Shlok Raz is a 28 y.o. male   Past medical history of otherwise healthy young man who presents the emergency department with left-sided tooth pain/swelling.  He broke a tooth off on a chicken bone about a week ago left lower molar and has had pain since then but then developed worsening pain and swelling to the left buccal area a couple of days ago and then saw dentist who prescribed amoxicillin but did not know procedure.  Since then the swelling and pain have gotten worse.  He has no difficulty with breathing or swallowing.  He denies any purulent discharge.  Independent Historian contributed to assessment above: His grandmother is at bedside to corroborate information past medical history above    Physical Exam   Triage Vital Signs: ED Triage Vitals  Encounter Vitals Group     BP 10/02/23 2129 (!) 165/97     Systolic BP Percentile --      Diastolic BP Percentile --      Pulse Rate 10/02/23 2129 71     Resp 10/02/23 2129 18     Temp 10/02/23 2129 98.9 F (37.2 C)     Temp Source 10/02/23 2129 Oral     SpO2 10/02/23 2129 100 %     Weight 10/02/23 2125 135 lb (61.2 kg)     Height 10/02/23 2125 5\' 6"  (1.676 m)     Head Circumference --      Peak Flow --      Pain Score 10/02/23 2125 7     Pain Loc --      Pain Education --      Exclude from Growth Chart --     Most recent vital signs: Vitals:   10/02/23 2129  BP: (!) 165/97  Pulse: 71  Resp: 18  Temp: 98.9 F (37.2 C)  SpO2: 100%    General: Awake, no distress.  CV:  Good peripheral perfusion.  Resp:  Normal effort.  Abd:  No distention.  Other:  He does have a missing tooth left lower molar as well as swelling to the left lower buccal area and no obvious fluctuance or drainage that I can see.  He has no  trismus and no posterior oropharynx abnormalities, no submandibular swelling or neck swelling and his neck supple with full range of motion.   ED Results / Procedures / Treatments   Labs (all labs ordered are listed, but only abnormal results are displayed) Labs Reviewed  CBC - Abnormal; Notable for the following components:      Result Value   RDW 11.4 (*)    All other components within normal limits  BASIC METABOLIC PANEL WITH GFR     I ordered and reviewed the above labs they are notable for white blood cell count is normal   PROCEDURES:  Critical Care performed: No  .Incision and Drainage  Date/Time: 10/03/2023 5:37 AM  Performed by: Pilar Jarvis, MD Authorized by: Pilar Jarvis, MD   Consent:    Consent obtained:  Verbal   Consent given by:  Patient   Risks discussed:  Bleeding, incomplete drainage, infection and damage to other organs   Alternatives discussed:  No treatment Universal protocol:    Procedure explained and questions answered to patient or proxy's satisfaction:  yes     Patient identity confirmed:  Verbally with patient Location:    Type:  Abscess   Location: mouth. Sedation:    Sedation type:  None Anesthesia:    Anesthesia method:  Local infiltration   Local anesthetic:  Lidocaine 1% w/o epi Procedure type:    Complexity:  Complex Procedure details:    Incision types:  Stab incision   Incision depth:  Submucosal   Wound management:  Probed and deloculated   Drainage:  Purulent   Drainage amount:  Moderate   Wound treatment:  Wound left open   Packing materials:  None Post-procedure details:    Procedure completion:  Tolerated    MEDICATIONS ORDERED IN ED: Medications  amoxicillin-clavulanate (AUGMENTIN) 875-125 MG per tablet 1 tablet (1 tablet Oral Given 10/03/23 0059)  dexamethasone (DECADRON) injection 10 mg (10 mg Intramuscular Given 10/03/23 0100)  ketorolac (TORADOL) 30 MG/ML injection 30 mg (30 mg Intramuscular Given 10/03/23 0100)   lidocaine-EPINEPHrine (XYLOCAINE W/EPI) 1 %-1:100000 (with pres) injection 20 mL (20 mLs Other Given 10/03/23 0103)     IMPRESSION / MDM / ASSESSMENT AND PLAN / ED COURSE  I reviewed the triage vital signs and the nursing notes.                                Patient's presentation is most consistent with acute presentation with potential threat to life or bodily function.  Differential diagnosis includes, but is not limited to, dental infection, sialolithiasis, sialoadenitis, abscess, considered but less likely airway obstruction or Ludwig   The patient is on the cardiac monitor to evaluate for evidence of arrhythmia and/or significant heart rate changes.  MDM:    Is a patient with a fractured tooth that led to swelling and pain over the last few days most likely dental infection, odontogenic source of swelling or abscess suspected, also on the differential is sialolithiasis.  There is no sign of airway obstruction and is nontoxic and well-appearing.  I will give him medications including anti-inflammatory medications and also did perform a block and see if I can elicit any drainage from the area, see procedure note.  Offered CT scan but patient declines at this time as he is able to see his dentist first thing in the morning tomorrow, I emphasized the importance of following up with the dentist as I believe this is odontogenic.  He knows to return if there is any rapid worsening        FINAL CLINICAL IMPRESSION(S) / ED DIAGNOSES   Final diagnoses:  Pain, dental  Dental infection     Rx / DC Orders   ED Discharge Orders          Ordered    amoxicillin-clavulanate (AUGMENTIN) 875-125 MG tablet  2 times daily        10/03/23 0025             Note:  This document was prepared using Dragon voice recognition software and may include unintentional dictation errors.    Buell Carmin, MD 10/03/23 [phone]

## 2024-03-01 ENCOUNTER — Other Ambulatory Visit: Payer: Self-pay

## 2024-03-01 ENCOUNTER — Emergency Department (HOSPITAL_COMMUNITY)
Admission: EM | Admit: 2024-03-01 | Discharge: 2024-03-02 | Discharge status: Against Medical Advice | Attending: Emergency Medicine | Admitting: Emergency Medicine

## 2024-03-01 ENCOUNTER — Encounter (HOSPITAL_COMMUNITY): Payer: Self-pay | Admitting: *Deleted

## 2024-03-01 DIAGNOSIS — K0889 Other specified disorders of teeth and supporting structures: Secondary | ICD-10-CM | POA: Insufficient documentation

## 2024-03-01 DIAGNOSIS — Z5321 Procedure and treatment not carried out due to patient leaving prior to being seen by health care provider: Secondary | ICD-10-CM | POA: Insufficient documentation

## 2024-03-01 NOTE — ED Triage Notes (Signed)
 States he has dental pain on left bottom side of his mouth on & off for a while. Denies any other s/s at this time.

## 2024-03-01 NOTE — ED Triage Notes (Signed)
 Pt c/o left lower dental pain for some time. Pain is intermittent in severity

## 2024-03-03 ENCOUNTER — Other Ambulatory Visit: Payer: Self-pay

## 2024-03-03 ENCOUNTER — Encounter (HOSPITAL_COMMUNITY): Payer: Self-pay | Admitting: Emergency Medicine

## 2024-03-03 ENCOUNTER — Emergency Department (HOSPITAL_COMMUNITY)
Admission: EM | Admit: 2024-03-03 | Discharge: 2024-03-03 | Discharge status: Against Medical Advice | Attending: Emergency Medicine | Admitting: Emergency Medicine

## 2024-03-03 DIAGNOSIS — K0889 Other specified disorders of teeth and supporting structures: Secondary | ICD-10-CM | POA: Diagnosis present

## 2024-03-03 DIAGNOSIS — Z5321 Procedure and treatment not carried out due to patient leaving prior to being seen by health care provider: Secondary | ICD-10-CM | POA: Diagnosis not present

## 2024-03-03 NOTE — ED Triage Notes (Signed)
 Pt c/o left dental pain for several days with no relief.

## 2024-03-03 NOTE — ED Notes (Signed)
No answer when called for treatment room.  ?

## 2024-03-04 ENCOUNTER — Emergency Department (HOSPITAL_COMMUNITY)
Admission: EM | Admit: 2024-03-04 | Discharge: 2024-03-04 | Disposition: A | Discharge status: Home / Self Care | Attending: Emergency Medicine | Admitting: Emergency Medicine

## 2024-03-04 ENCOUNTER — Encounter (HOSPITAL_COMMUNITY): Payer: Self-pay | Admitting: *Deleted

## 2024-03-04 ENCOUNTER — Other Ambulatory Visit: Payer: Self-pay

## 2024-03-04 DIAGNOSIS — K0381 Cracked tooth: Secondary | ICD-10-CM | POA: Diagnosis not present

## 2024-03-04 DIAGNOSIS — K0889 Other specified disorders of teeth and supporting structures: Secondary | ICD-10-CM | POA: Insufficient documentation

## 2024-03-04 MED ORDER — IBUPROFEN 600 MG PO TABS: 600.0000 mg | ORAL_TABLET | Freq: Four times a day (QID) | ORAL | 0 refills | Status: AC | PRN

## 2024-03-04 MED ORDER — PENICILLIN V POTASSIUM 250 MG PO TABS
500.0000 mg | ORAL_TABLET | Freq: Once | ORAL | Status: AC
  Administered 2024-03-04: 500 mg via ORAL
  Filled 2024-03-04: qty 2

## 2024-03-04 MED ORDER — PENICILLIN V POTASSIUM 500 MG PO TABS: 500.0000 mg | ORAL_TABLET | Freq: Four times a day (QID) | ORAL | 0 refills | Status: AC

## 2024-03-04 MED ORDER — KETOROLAC TROMETHAMINE 30 MG/ML IJ SOLN
30.0000 mg | Freq: Once | INTRAMUSCULAR | Status: DC
  Filled 2024-03-04: qty 1

## 2024-03-04 NOTE — ED Triage Notes (Signed)
 The pt reports dental pain yesterday and acid reflux

## 2024-03-04 NOTE — ED Provider Notes (Signed)
 [place] EMERGENCY DEPARTMENT AT [facility] Provider Note   CSN: [id number] Arrival date & time: 03/04/24  0047     Patient presents with: Dental Pain   Steven Stein is a 28 y.o. male.   HPI     This is a 28 year old male who presents with dental pain.  Reports several day history of left lower dental pain.  States that this has been ongoing since his tooth fell out.  No difficulty swallowing.  No fevers.  Has been taking meloxicam  with minimal relief.  Prior to Admission medications   Medication Sig Start Date End Date Taking? Authorizing Provider  ibuprofen  (ADVIL ) 600 MG tablet Take 1 tablet (600 mg total) by mouth every 6 (six) hours as needed. 03/04/24  Yes Harol Shabazz, Charmaine FALCON, MD  penicillin  v potassium (VEETID) 500 MG tablet Take 1 tablet (500 mg total) by mouth 4 (four) times daily for 10 days. 03/04/24 03/14/24 Yes Abisai Coble, Charmaine FALCON, MD    Allergies: Patient has no known allergies.    Review of Systems  Constitutional:  Negative for fever.  HENT:  Positive for dental problem. Negative for trouble swallowing.   All other systems reviewed and are negative.   Updated Vital Signs BP (!) 143/76 (BP Location: Right Arm)   Pulse 65   Temp 97.9 F (36.6 C)   Resp 18   Ht 1.676 m (5' 6)   Wt 61.2 kg   SpO2 99%   BMI 21.78 kg/m   Physical Exam Vitals and nursing note reviewed.  Constitutional:      Appearance: He is well-developed.  HENT:     Head: Normocephalic and atraumatic.     Mouth/Throat:      Comments: Tooth #18 fractured down to the base with minimal remnant, no adjacent obvious abscess No trismus, no fullness under the tongue Eyes:     Pupils: Pupils are equal, round, and reactive to light.  Cardiovascular:     Rate and Rhythm: Normal rate and regular rhythm.  Pulmonary:     Effort: Pulmonary effort is normal.     Breath sounds: Normal breath sounds.  Abdominal:     Palpations: Abdomen is soft.  Musculoskeletal:     Cervical  back: Neck supple.  Lymphadenopathy:     Cervical: No cervical adenopathy.  Skin:    General: Skin is warm and dry.  Neurological:     Mental Status: He is alert and oriented to person, place, and time.  Psychiatric:        Mood and Affect: Mood normal.     (all labs ordered are listed, but only abnormal results are displayed) Labs Reviewed - No data to display  EKG: None  Radiology: No results found.   Procedures   Medications Ordered in the ED  penicillin  v potassium (VEETID) tablet 500 mg (has no administration in time range)  ketorolac  (TORADOL ) 30 MG/ML injection 30 mg (has no administration in time range)                                    Medical Decision Making Risk Prescription drug management.   This patient presents to the ED for concern of dental pain, this involves an extensive number of treatment options, and is a complaint that carries with it a high risk of complications and morbidity.  I considered the following differential and admission for this acute, potentially life threatening condition.  The differential diagnosis includes abscess, underlying infection, fracture  MDM:    This is a 28 year old male who presents with dental pain.  Overall nontoxic and vital signs are reassuring.  He has a fractured lower molar with no obvious abscess.  Will treat with penicillin  and NSAIDs.  He was given dental follow-up.  No signs or symptoms of deep space infection.  (Labs, imaging, consults)  Labs: I Ordered, and personally interpreted labs.  The pertinent results include: None  Imaging Studies ordered: I ordered imaging studies including none I independently visualized and interpreted imaging. I agree with the radiologist interpretation  Additional history obtained from chart review.  External records from outside source obtained and reviewed including prior evaluations  Cardiac Monitoring: The patient was not maintained on a cardiac monitor.  If on  the cardiac monitor, I personally viewed and interpreted the cardiac monitored which showed an underlying rhythm of: N/A  Reevaluation: After the interventions noted above, I reevaluated the patient and found that they have :stayed the same  Social Determinants of Health:  lives independently  Disposition: Discharge  Co morbidities that complicate the patient evaluation History reviewed. No pertinent past medical history.   Medicines Meds ordered this encounter  Medications   penicillin  v potassium (VEETID) tablet 500 mg   ketorolac  (TORADOL ) 30 MG/ML injection 30 mg   penicillin  v potassium (VEETID) 500 MG tablet    Sig: Take 1 tablet (500 mg total) by mouth 4 (four) times daily for 10 days.    Dispense:  40 tablet    Refill:  0   ibuprofen  (ADVIL ) 600 MG tablet    Sig: Take 1 tablet (600 mg total) by mouth every 6 (six) hours as needed.    Dispense:  30 tablet    Refill:  0    I have reviewed the patients home medicines and have made adjustments as needed  Problem List / ED Course: Problem List Items Addressed This Visit   None Visit Diagnoses       Pain, dental    -  Primary                Final diagnoses:  Pain, dental    ED Discharge Orders          Ordered    penicillin  v potassium (VEETID) 500 MG tablet  4 times daily        03/04/24 0619    ibuprofen  (ADVIL ) 600 MG tablet  Every 6 hours PRN        03/04/24 0619               Bari Charmaine FALCON, MD 03/04/24 [phone]

## 2024-03-04 NOTE — Discharge Instructions (Signed)
 You were seen today for dental pain.  Often times this indicates an underlying infection.  Take antibiotic as prescribed.  Take ibuprofen  for pain.  Follow-up with dentistry as an outpatient.  Resources were provided.

## 2024-03-04 NOTE — ED Triage Notes (Signed)
 Pt reports dental pain and hot flashes onset today. Denies fevers or vomiting.

## 2024-03-05 ENCOUNTER — Emergency Department (HOSPITAL_COMMUNITY): Admission: EM | Admit: 2024-03-05 | Discharge: 2024-03-05 | Disposition: A | Discharge status: Home / Self Care

## 2024-03-05 ENCOUNTER — Encounter (HOSPITAL_COMMUNITY): Payer: Self-pay

## 2024-03-05 DIAGNOSIS — R1013 Epigastric pain: Secondary | ICD-10-CM | POA: Diagnosis present

## 2024-03-05 DIAGNOSIS — K219 Gastro-esophageal reflux disease without esophagitis: Secondary | ICD-10-CM | POA: Diagnosis not present

## 2024-03-05 MED ORDER — ONDANSETRON 4 MG PO TBDP
4.0000 mg | ORAL_TABLET | Freq: Once | ORAL | Status: AC
  Administered 2024-03-05: 4 mg via ORAL
  Filled 2024-03-05: qty 1

## 2024-03-05 MED ORDER — ALUM & MAG HYDROXIDE-SIMETH 200-200-20 MG/5ML PO SUSP
30.0000 mL | Freq: Once | ORAL | Status: AC
  Administered 2024-03-05: 30 mL via ORAL
  Filled 2024-03-05: qty 30

## 2024-03-05 MED ORDER — PANTOPRAZOLE SODIUM 20 MG PO TBEC: 20.0000 mg | DELAYED_RELEASE_TABLET | Freq: Every day | ORAL | 0 refills | Status: AC

## 2024-03-05 MED ORDER — ONDANSETRON HCL 4 MG PO TABS: 4.0000 mg | ORAL_TABLET | Freq: Three times a day (TID) | ORAL | 0 refills | Status: AC | PRN

## 2024-03-05 MED ORDER — ALUM & MAG HYDROXIDE-SIMETH 200-200-20 MG/5ML PO SUSP: 30.0000 mL | Freq: Once | ORAL | Status: DC

## 2024-03-05 MED ORDER — ONDANSETRON 4 MG PO TBDP: 4.0000 mg | ORAL_TABLET | Freq: Once | ORAL | Status: DC

## 2024-03-05 NOTE — ED Provider Notes (Signed)
 [place] EMERGENCY DEPARTMENT AT [facility] Provider Note   CSN: [id number] Arrival date & time: 03/05/24  0041     Patient presents with: Gastroesophageal Reflux   Steven Stein is a 28 y.o. male without much significant past medical history but recurrent dental pain who presents today with epigastric pain after eating. He characterizes it as a burning pain. He has associated belching.  His symptoms began after he took a dose of the antibiotic yesterday here in the emergency department.  He said that he also had some mild itching on his arm that was new for him as well.  His tooth pain has improved from his last visit.  He has never had any reflux before this.  His symptoms improved after receiving Maalox and Zofran  in the ED. He has no associated chest pain, shortness of breath, trouble swallowing, new rash, headaches, acute changes in vision, lower leg swelling, arm pain, palpitations.  He does have some itching over his right arm.  He does have a history of open heart surgery when he was 28 years old. No other PMH other than recurrent dental pain.     Prior to Admission medications   Medication Sig Start Date End Date Taking? Authorizing Provider  ondansetron  (ZOFRAN ) 4 MG tablet Take 1 tablet (4 mg total) by mouth every 8 (eight) hours as needed for nausea or vomiting. 03/05/24  Yes Jishnu Jenniges, Melvenia, MD  pantoprazole  (PROTONIX ) 20 MG tablet Take 1 tablet (20 mg total) by mouth daily. 03/05/24  Yes Wali Reinheimer, Melvenia, MD  ibuprofen  (ADVIL ) 600 MG tablet Take 1 tablet (600 mg total) by mouth every 6 (six) hours as needed. 03/04/24   Horton, Charmaine FALCON, MD  penicillin  v potassium (VEETID) 500 MG tablet Take 1 tablet (500 mg total) by mouth 4 (four) times daily for 10 days. 03/04/24 03/14/24  Horton, Charmaine FALCON, MD    Allergies: Patient has no known allergies.      Updated Vital Signs BP (!) 131/91 (BP Location: Right Arm)   Pulse 63   Temp 97.7 F (36.5 C)   Resp 14    SpO2 100%   Physical Exam Constitutional:      General: He is not in acute distress.    Appearance: He is not ill-appearing.  HENT:     Mouth/Throat:     Mouth: Mucous membranes are moist.  Eyes:     Extraocular Movements: Extraocular movements intact.     Pupils: Pupils are equal, round, and reactive to light.  Cardiovascular:     Rate and Rhythm: Normal rate and regular rhythm.  Pulmonary:     Effort: No respiratory distress.     Breath sounds: Normal breath sounds.  Abdominal:     General: Abdomen is flat. There is no distension.     Palpations: There is no mass.     Tenderness: There is no abdominal tenderness.  Musculoskeletal:        General: No swelling.     Right lower leg: No edema.     Left lower leg: No edema.  Skin:    Comments: No area of rash over his right upper extremity where he has been itching.  Neurological:     Mental Status: He is alert.     (all labs ordered are listed, but only abnormal results are displayed) Labs Reviewed - No data to display  EKG: None  Radiology: No results found.    Medications Ordered in the ED  ondansetron  (ZOFRAN -ODT) disintegrating  tablet 4 mg (4 mg Oral Given 03/05/24 0100)  alum & mag hydroxide-simeth (MAALOX/MYLANTA) 200-200-20 MG/5ML suspension 30 mL (30 mLs Oral Given 03/05/24 0100)    Clinical Course as of 03/05/24 0806  Mon Mar 05, 2024  0753 Evaluated.  He is asymptomatic currently and feeling improved after medications.  Vital signs reassuring.  Benign abdominal exam.  [TY]    Clinical Course User Index [TY] Neysa Caron PARAS, DO                                Medical Decision Making Risk OTC drugs. Prescription drug management.   This patient presents to the ED for concern of reflux and epigastric pain, this involves an extensive number of treatment options, and is a complaint that carries with it a high risk of complications and morbidity.  The differential diagnosis includes gastroesophageal  reflux, pancreatitis, myocardial infarction, esophageal rupture, gastritis.  His symptoms have improved after receiving Maalox and Zofran .  Likely that he had some reflux after taking the penicillin  potassium.  He has no associated chest pain or shortness of breath.  He has no other risk factors for MI.  He has no emesis.  He currently is asymptomatic and his vital signs are stable.  He is stable for discharge without further workup.  We will discharge him with Zofran  and Protonix  for while he is taking his penicillin , and he can likely discontinue these medications after this.   Co morbidities that complicate the patient evaluation  None   Problem List / ED Course / Critical interventions / Medication management  Reflux I ordered medication including Maalox and Zofran   for GERD  Reevaluation of the patient after these medicines showed that the patient resolved I have reviewed the patients home medicines and have made adjustments as needed   Social Determinants of Health:  This patient does not have a PCP and receives most of his care in the ER.  This significantly impacts his health.  He also does not see a dentist and has recurrent dental pain causing multiple ED visits.   Test / Admission - Considered:  The patient does not require admission as his vital signs are stable, he is asymptomatic, and has no further complaints at this time.  He was given discharge instructions with return precautions.  His medications were sent to the pharmacy.    Final diagnoses:  Gastroesophageal reflux disease, unspecified whether esophagitis present    ED Discharge Orders          Ordered    pantoprazole  (PROTONIX ) 20 MG tablet  Daily        03/05/24 0756    ondansetron  (ZOFRAN ) 4 MG tablet  Every 8 hours PRN        03/05/24 0756               Napoleon Limes, MD 03/05/24 [id number]    Neysa Caron PARAS, DO 03/05/24 1500

## 2024-03-05 NOTE — Discharge Instructions (Signed)
 Thank you, Mr. Shameer Molstad, for allowing us  to provide your care today. Today we discussed . . .  > Reflux       - Your symptoms are consistent with reflux, possibly after the pills he took.  Your symptoms improved after we gave you a medication that treats this.  I will send you a prescription for a antinausea medication as well as a prescription for a acid suppressor.   I have ordered the following labs for you:  Lab Orders  No laboratory test(s) ordered today      Medications ordered for pickup at your pharmacy Zofran  4 mg every 8 hours as needed Protonix  20 mg daily for 30 days Ibuprofen  600 mg every 6 hours as needed Penicillin  500 mg 4 times daily for 10 days  Follow up: With a dentist.  If you can, you should try and find a primary care doctor.       Melvenia Morrison, [employer] [facility]

## 2024-03-05 NOTE — ED Triage Notes (Signed)
 Pt was has been seen multiple times for multiple complaints but tonight comes in for acid reflux that is worse after he eats. He states that the reflux is also causing nausea. Pt was seen for dental pain yesterday and prescribed an  antibiotic, but has not gotten it filled yet and reports that his dental pain has improved.

## 2024-03-12 ENCOUNTER — Other Ambulatory Visit: Payer: Self-pay

## 2024-03-12 ENCOUNTER — Encounter (HOSPITAL_COMMUNITY): Payer: Self-pay | Admitting: *Deleted

## 2024-03-12 ENCOUNTER — Emergency Department (HOSPITAL_COMMUNITY)
Admission: EM | Admit: 2024-03-12 | Discharge: 2024-03-12 | Disposition: A | Discharge status: Home / Self Care | Attending: Emergency Medicine | Admitting: Emergency Medicine

## 2024-03-12 DIAGNOSIS — R1084 Generalized abdominal pain: Secondary | ICD-10-CM | POA: Diagnosis present

## 2024-03-12 DIAGNOSIS — R110 Nausea: Secondary | ICD-10-CM | POA: Insufficient documentation

## 2024-03-12 LAB — COMPREHENSIVE METABOLIC PANEL WITH GFR
ALT: 22 U/L (ref 0–44)
AST: 33 U/L (ref 15–41)
Albumin: 4.2 g/dL (ref 3.5–5.0)
Alkaline Phosphatase: 61 U/L (ref 38–126)
Anion gap: 10 (ref 5–15)
BUN: 15 mg/dL (ref 6–20)
CO2: 29 mmol/L (ref 22–32)
Calcium: 9.4 mg/dL (ref 8.9–10.3)
Chloride: 101 mmol/L (ref 98–111)
Creatinine, Ser: 1.23 mg/dL (ref 0.61–1.24)
GFR, Estimated: >60 mL/min (ref >60)
Glucose, Bld: 65 mg/dL — ABNORMAL LOW (ref 70–99)
Potassium: 4 mmol/L (ref 3.5–5.1)
Sodium: 140 mmol/L (ref 135–145)
Total Bilirubin: 0.9 mg/dL (ref 0.0–1.2)
Total Protein: 7 g/dL (ref 6.5–8.1)

## 2024-03-12 LAB — CBC
HCT: 47 % (ref 39.0–52.0)
Hemoglobin: 15.5 g/dL (ref 13.0–17.0)
MCH: 31.8 pg (ref 26.0–34.0)
MCHC: 33 g/dL (ref 30.0–36.0)
MCV: 96.3 fL (ref 80.0–100.0)
Platelets: 344 K/uL (ref 150–400)
RBC: 4.88 MIL/uL (ref 4.22–5.81)
RDW: 11.8 % (ref 11.5–15.5)
WBC: 7.6 K/uL (ref 4.0–10.5)
nRBC: 0 % (ref 0.0–0.2)

## 2024-03-12 LAB — URINALYSIS, ROUTINE W REFLEX MICROSCOPIC
Bilirubin Urine: NEGATIVE
Glucose, UA: NEGATIVE mg/dL
Hgb urine dipstick: NEGATIVE
Ketones, ur: NEGATIVE mg/dL
Leukocytes,Ua: NEGATIVE
Nitrite: NEGATIVE
Protein, ur: NEGATIVE mg/dL
Specific Gravity, Urine: 1.027 (ref 1.005–1.030)
pH: 5 (ref 5.0–8.0)

## 2024-03-12 LAB — CBG MONITORING, ED: Glucose-Capillary: 87 mg/dL (ref 70–99)

## 2024-03-12 LAB — LIPASE, BLOOD: Lipase: 35 U/L (ref 11–51)

## 2024-03-12 LAB — RESP PANEL BY RT-PCR (RSV, FLU A&B, COVID)  RVPGX2
Influenza A by PCR: NEGATIVE
Influenza B by PCR: NEGATIVE
Resp Syncytial Virus by PCR: NEGATIVE
SARS Coronavirus 2 by RT PCR: NEGATIVE

## 2024-03-12 MED ORDER — ONDANSETRON 4 MG PO TBDP: 4.0000 mg | ORAL_TABLET | Freq: Three times a day (TID) | ORAL | 0 refills | Status: AC | PRN

## 2024-03-12 MED ORDER — ONDANSETRON 4 MG PO TBDP
4.0000 mg | ORAL_TABLET | Freq: Once | ORAL | Status: AC
  Administered 2024-03-12: 4 mg via ORAL
  Filled 2024-03-12: qty 1

## 2024-03-12 NOTE — Discharge Instructions (Signed)
 Your blood work in the emergency department was reassuring.  Drink plenty of fluids to prevent dehydration.  You may use Tylenol  or ibuprofen  for abdominal pain or discomfort.  Follow-up with a primary doctor.

## 2024-03-12 NOTE — ED Triage Notes (Signed)
 Pt is here for evaluation of generalized abdominal pain which began Sunday morning. Pt is also having body aches.  LBM 9/21.  Pt has some nausea with this. No GU symptoms.

## 2024-03-12 NOTE — ED Provider Notes (Signed)
 [place] EMERGENCY DEPARTMENT AT [facility] Provider Note   CSN: [id number] Arrival date & time: 03/12/24  9975     Patient presents with: Abdominal Pain   Steven Stein is a 28 y.o. male.   28 year old male presents to the emergency department for evaluation of abdominal pain.  Symptoms began yesterday morning and have been persistent.  Describes a generalized cramping discomfort.  Has noted some bodyaches as well as nausea.  Denies taking any medications for his symptoms.  Did have a bowel movement yesterday which was normal.  No fevers, vomiting, diarrhea, melena or hematochezia, urinary symptoms.  He denies a history of abdominal surgeries.  Overall, states that he is feeling a bit better.  The history is provided by the patient. No language interpreter was used.  Abdominal Pain      Prior to Admission medications   Medication Sig Start Date End Date Taking? Authorizing Provider  ondansetron  (ZOFRAN -ODT) 4 MG disintegrating tablet Take 1 tablet (4 mg total) by mouth every 8 (eight) hours as needed for nausea or vomiting. 03/12/24  Yes Keith Sor, PA-C  ibuprofen  (ADVIL ) 600 MG tablet Take 1 tablet (600 mg total) by mouth every 6 (six) hours as needed. 03/04/24   Horton, Charmaine FALCON, MD  ondansetron  (ZOFRAN ) 4 MG tablet Take 1 tablet (4 mg total) by mouth every 8 (eight) hours as needed for nausea or vomiting. 03/05/24   Smucker, Melvenia, MD  pantoprazole  (PROTONIX ) 20 MG tablet Take 1 tablet (20 mg total) by mouth daily. 03/05/24   Smucker, Melvenia, MD  penicillin  v potassium (VEETID) 500 MG tablet Take 1 tablet (500 mg total) by mouth 4 (four) times daily for 10 days. 03/04/24 03/14/24  Horton, Charmaine FALCON, MD    Allergies: Patient has no known allergies.    Review of Systems  Gastrointestinal:  Positive for abdominal pain.  Ten systems reviewed and are negative for acute change, except as noted in the HPI.    Updated Vital Signs BP (!) 159/83 (BP Location:  Right Arm)   Pulse 89   Temp 98.2 F (36.8 C)   Resp 16   SpO2 99%   Physical Exam Vitals and nursing note reviewed.  Constitutional:      General: He is not in acute distress.    Appearance: He is well-developed. He is not diaphoretic.     Comments: Nontoxic appearing and in NAD  HENT:     Head: Normocephalic and atraumatic.  Eyes:     General: No scleral icterus.    Conjunctiva/sclera: Conjunctivae normal.  Cardiovascular:     Rate and Rhythm: Normal rate and regular rhythm.     Pulses: Normal pulses.  Pulmonary:     Effort: Pulmonary effort is normal. No respiratory distress.     Comments: Respirations even and unlabored Abdominal:     Palpations: Abdomen is soft. There is no mass.     Tenderness: There is no abdominal tenderness. There is no guarding.     Comments: No focal abdominal pain or distention.  No palpable masses or peritoneal signs.  Musculoskeletal:        General: Normal range of motion.     Cervical back: Normal range of motion.  Skin:    General: Skin is warm and dry.     Coloration: Skin is not pale.     Findings: No erythema or rash.  Neurological:     Mental Status: He is alert and oriented to person, place, and time.  Coordination: Coordination normal.  Psychiatric:        Behavior: Behavior normal.     (all labs ordered are listed, but only abnormal results are displayed) Labs Reviewed  COMPREHENSIVE METABOLIC PANEL WITH GFR - Abnormal; Notable for the following components:      Result Value   Glucose, Bld 65 (*)    All other components within normal limits  RESP PANEL BY RT-PCR (RSV, FLU A&B, COVID)  RVPGX2  LIPASE, BLOOD  CBC  URINALYSIS, ROUTINE W REFLEX MICROSCOPIC  CBG MONITORING, ED    EKG: None  Radiology: No results found.   Procedures   Medications Ordered in the ED  ondansetron  (ZOFRAN -ODT) disintegrating tablet 4 mg (4 mg Oral Given 03/12/24 0259)    Clinical Course as of 03/12/24 0442  Mon Mar 12, 2024  0319  Given PO Zofran  and juice. Reported feeling better prior to receiving medications. [KH]  0441 CBG improved after PO intake.  [KH]    Clinical Course User Index [KH] Keith Sor, PA-C                                 Medical Decision Making Amount and/or Complexity of Data Reviewed Labs: ordered.  Risk Prescription drug management.   This patient presents to the ED for concern of abdominal pain, this involves an extensive number of treatment options, and is a complaint that carries with it a high risk of complications and morbidity.  The differential diagnosis includes viral illness vs pSBO/SBO vs constipation vs IBS vs IBD vs ruptured viscous    Co morbidities that complicate the patient evaluation  None known   Lab Tests:  I Ordered, and personally interpreted labs.  The pertinent results include:  Glucose 65   Cardiac Monitoring:  The patient was maintained on a cardiac monitor.  I personally viewed and interpreted the cardiac monitored which showed an underlying rhythm of: NSR   Medicines ordered and prescription drug management:  I ordered medication including Zofran  for nausea  Reevaluation of the patient after these medicines showed that the patient improved I have reviewed the patients home medicines and have made adjustments as needed   Test Considered:  CT abd/pelvis   Problem List / ED Course:  Nonspecific abdominal pain with nausea. Exam benign; no tenderness. Labs ordered in triage and reviewed. Aside from hypoglyemia, they are normal. CBG improving with PO intake. Afebrile in the ED. No criteria for SIRS, no sepsis concerns. Question viral etiology. Do not feel emergent imaging is presently indicated.    Reevaluation:  After the interventions noted above, I reevaluated the patient and found that they have :stayed the same   Social Determinants of Health:  Lives independently   Dispostion:  After consideration of the diagnostic results and  the patients response to treatment, I feel that the patent would benefit from outpatient supportive care, PCP f/u. Return precautions discussed and provided. Patient discharged in stable condition with no unaddressed concerns.       Final diagnoses:  Generalized abdominal pain    ED Discharge Orders          Ordered    ondansetron  (ZOFRAN -ODT) 4 MG disintegrating tablet  Every 8 hours PRN        03/12/24 0437               Keith Sor, PA-C 03/12/24 0446    Carita Senior, MD 03/12/24 [phone]

## 2024-03-20 ENCOUNTER — Encounter (HOSPITAL_COMMUNITY): Payer: Self-pay

## 2024-03-20 ENCOUNTER — Emergency Department (HOSPITAL_COMMUNITY)
Admission: EM | Admit: 2024-03-20 | Discharge: 2024-03-21 | Discharge status: Against Medical Advice | Attending: Emergency Medicine | Admitting: Emergency Medicine

## 2024-03-20 DIAGNOSIS — Z5321 Procedure and treatment not carried out due to patient leaving prior to being seen by health care provider: Secondary | ICD-10-CM | POA: Insufficient documentation

## 2024-03-20 DIAGNOSIS — R067 Sneezing: Secondary | ICD-10-CM | POA: Insufficient documentation

## 2024-03-20 DIAGNOSIS — R1031 Right lower quadrant pain: Secondary | ICD-10-CM | POA: Diagnosis present

## 2024-03-20 DIAGNOSIS — R059 Cough, unspecified: Secondary | ICD-10-CM | POA: Diagnosis not present

## 2024-03-20 LAB — URINALYSIS, ROUTINE W REFLEX MICROSCOPIC
Bilirubin Urine: NEGATIVE
Glucose, UA: NEGATIVE mg/dL
Hgb urine dipstick: NEGATIVE
Ketones, ur: NEGATIVE mg/dL
Leukocytes,Ua: NEGATIVE
Nitrite: NEGATIVE
Protein, ur: NEGATIVE mg/dL
Specific Gravity, Urine: 1.014 (ref 1.005–1.030)
pH: 6 (ref 5.0–8.0)

## 2024-03-20 LAB — CBC WITH DIFFERENTIAL/PLATELET
Abs Immature Granulocytes: 0.01 K/uL (ref 0.00–0.07)
Basophils Absolute: 0.1 K/uL (ref 0.0–0.1)
Basophils Relative: 1 %
Eosinophils Absolute: 0.3 K/uL (ref 0.0–0.5)
Eosinophils Relative: 5 %
HCT: 43.4 % (ref 39.0–52.0)
Hemoglobin: 14.8 g/dL (ref 13.0–17.0)
Immature Granulocytes: 0 %
Lymphocytes Relative: 28 %
Lymphs Abs: 1.6 K/uL (ref 0.7–4.0)
MCH: 32 pg (ref 26.0–34.0)
MCHC: 34.1 g/dL (ref 30.0–36.0)
MCV: 93.9 fL (ref 80.0–100.0)
Monocytes Absolute: 0.4 K/uL (ref 0.1–1.0)
Monocytes Relative: 8 %
Neutro Abs: 3.3 K/uL (ref 1.7–7.7)
Neutrophils Relative %: 58 %
Platelets: 323 K/uL (ref 150–400)
RBC: 4.62 MIL/uL (ref 4.22–5.81)
RDW: 11.5 % (ref 11.5–15.5)
WBC: 5.6 K/uL (ref 4.0–10.5)
nRBC: 0 % (ref 0.0–0.2)

## 2024-03-20 LAB — COMPREHENSIVE METABOLIC PANEL WITH GFR
ALT: 22 U/L (ref 0–44)
AST: 33 U/L (ref 15–41)
Albumin: 4.1 g/dL (ref 3.5–5.0)
Alkaline Phosphatase: 61 U/L (ref 38–126)
Anion gap: 10 (ref 5–15)
BUN: 11 mg/dL (ref 6–20)
CO2: 25 mmol/L (ref 22–32)
Calcium: 9.1 mg/dL (ref 8.9–10.3)
Chloride: 104 mmol/L (ref 98–111)
Creatinine, Ser: 0.92 mg/dL (ref 0.61–1.24)
GFR, Estimated: >60 mL/min (ref >60)
Glucose, Bld: 161 mg/dL — ABNORMAL HIGH (ref 70–99)
Potassium: 3.5 mmol/L (ref 3.5–5.1)
Sodium: 139 mmol/L (ref 135–145)
Total Bilirubin: 0.6 mg/dL (ref 0.0–1.2)
Total Protein: 6.5 g/dL (ref 6.5–8.1)

## 2024-03-20 LAB — LIPASE, BLOOD: Lipase: 41 U/L (ref 11–51)

## 2024-03-20 NOTE — ED Provider Triage Note (Signed)
 Emergency Medicine Provider Triage Evaluation Note  Steven Stein , a 28 y.o. male  was evaluated in triage.  Pt complains of abd pain. Pain to R side of abdomen and groin region since yesterday.  No fever, chills, n/v/d, dysuria, penile discharge or hernia.  Did notice increase urinary frequency.  Also c/o dental pain  Review of Systems  Positive: As above Negative: As above  Physical Exam  BP (!) 151/71 (BP Location: Right Arm)   Pulse 83   Temp 99 F (37.2 C)   Resp 18   SpO2 100%  Gen:   Awake, no distress   Resp:  Normal effort  MSK:   Moves extremities without difficulty  Other:    Medical Decision Making  Medically screening exam initiated at 8:36 PM.  Appropriate orders placed.  Clemon Gasaway was informed that the remainder of the evaluation will be completed by another provider, this initial triage assessment does not replace that evaluation, and the importance of remaining in the ED until their evaluation is complete.     Nivia Colon, PA-C 03/20/24 2036

## 2024-03-20 NOTE — ED Triage Notes (Signed)
 Patient in ED with complaints of pain in his groin when he coughs and sneezes. This started about 2 days ago.  Only hurts when he strains.

## 2024-03-21 NOTE — ED Notes (Signed)
 Patient stated he wanted to leave, and handed me his labels.

## 2024-03-22 ENCOUNTER — Other Ambulatory Visit: Payer: Self-pay

## 2024-03-22 ENCOUNTER — Encounter (HOSPITAL_COMMUNITY): Payer: Self-pay

## 2024-03-22 ENCOUNTER — Emergency Department (HOSPITAL_COMMUNITY)
Admission: EM | Admit: 2024-03-22 | Discharge: 2024-03-22 | Disposition: A | Discharge status: Home / Self Care | Attending: Emergency Medicine | Admitting: Emergency Medicine

## 2024-03-22 DIAGNOSIS — K0889 Other specified disorders of teeth and supporting structures: Secondary | ICD-10-CM | POA: Diagnosis present

## 2024-03-22 MED ORDER — IBUPROFEN 400 MG PO TABS
600.0000 mg | ORAL_TABLET | Freq: Once | ORAL | Status: AC
  Administered 2024-03-22: 600 mg via ORAL
  Filled 2024-03-22: qty 1

## 2024-03-22 MED ORDER — IBUPROFEN 600 MG PO TABS: 600.0000 mg | ORAL_TABLET | Freq: Four times a day (QID) | ORAL | 0 refills | Status: AC | PRN

## 2024-03-22 MED ORDER — AMOXICILLIN 500 MG PO CAPS: 500.0000 mg | ORAL_CAPSULE | Freq: Three times a day (TID) | ORAL | 0 refills | Status: AC

## 2024-03-22 MED ORDER — AMOXICILLIN 500 MG PO CAPS
500.0000 mg | ORAL_CAPSULE | Freq: Once | ORAL | Status: AC
  Administered 2024-03-22: 500 mg via ORAL
  Filled 2024-03-22: qty 1

## 2024-03-22 NOTE — ED Provider Notes (Signed)
 [place] EMERGENCY DEPARTMENT AT [facility] Provider Note   CSN: [id number] Arrival date & time: 03/22/24  8187     Patient presents with: Dental Pain   Steven Stein is a 28 y.o. male has right-sided facial pain and dental pain, previously seen in the middle of September for the same, prescribed a course of penicillin  VK as well as ibuprofen  for management of his pain.  He states that he got the single dose while he was here in the ED however never picked up the prescription as he states that he did not know where to pick it up.  Continues to endorse severe pain, can swallow without difficulty, pain is affected by temperature as well as with intake of sweet liquids and foods.    Dental Pain      Prior to Admission medications   Medication Sig Start Date End Date Taking? Authorizing Provider  amoxicillin  (AMOXIL ) 500 MG capsule Take 1 capsule (500 mg total) by mouth 3 (three) times daily. 03/22/24  Yes Myriam Dorn BROCKS, PA  ibuprofen  (ADVIL ) 600 MG tablet Take 1 tablet (600 mg total) by mouth every 6 (six) hours as needed. 03/22/24  Yes Myriam Dorn BROCKS, PA  ibuprofen  (ADVIL ) 600 MG tablet Take 1 tablet (600 mg total) by mouth every 6 (six) hours as needed. 03/04/24   Horton, Charmaine FALCON, MD  ondansetron  (ZOFRAN ) 4 MG tablet Take 1 tablet (4 mg total) by mouth every 8 (eight) hours as needed for nausea or vomiting. 03/05/24   Smucker, Melvenia, MD  ondansetron  (ZOFRAN -ODT) 4 MG disintegrating tablet Take 1 tablet (4 mg total) by mouth every 8 (eight) hours as needed for nausea or vomiting. 03/12/24   Keith Sor, PA-C  pantoprazole  (PROTONIX ) 20 MG tablet Take 1 tablet (20 mg total) by mouth daily. 03/05/24   Smucker, Melvenia, MD    Allergies: Patient has no known allergies.    Review of Systems  HENT:  Positive for dental problem.   All other systems reviewed and are negative.   Updated Vital Signs BP (!) 167/101   Pulse 67   Temp 98.9 F (37.2 C)  (Axillary)   Resp 18   SpO2 100%   Physical Exam Vitals and nursing note reviewed.  Constitutional:      General: He is not in acute distress.    Appearance: He is well-developed.  HENT:     Head: Normocephalic and atraumatic.     Mouth/Throat:     Comments: No dental abscess pockets appreciated, no facial swelling appreciated, multiple small carious lesions appreciated. Eyes:     Conjunctiva/sclera: Conjunctivae normal.  Cardiovascular:     Rate and Rhythm: Normal rate and regular rhythm.     Heart sounds: No murmur heard. Pulmonary:     Effort: Pulmonary effort is normal. No respiratory distress.     Breath sounds: Normal breath sounds.  Abdominal:     Palpations: Abdomen is soft.     Tenderness: There is no abdominal tenderness.  Musculoskeletal:        General: No swelling.     Cervical back: Neck supple.  Skin:    General: Skin is warm and dry.     Capillary Refill: Capillary refill takes less than 2 seconds.  Neurological:     Mental Status: He is alert.  Psychiatric:        Mood and Affect: Mood normal.     (all labs ordered are listed, but only abnormal results are displayed) Labs Reviewed -  No data to display  EKG: None  Radiology: No results found.   Procedures   Medications Ordered in the ED  ibuprofen  (ADVIL ) tablet 600 mg (has no administration in time range)  amoxicillin  (AMOXIL ) capsule 500 mg (has no administration in time range)                                    Medical Decision Making Risk Prescription drug management.   Given his prior history, as well as not having taken continued antibiotics, if this is continued dental pain and will manage with a new prescription for amoxicillin  as well as prescription ibuprofen , give him dose of same while he is here in the ED.  Provided with referrals for dentistry that will work with Medicaid, as this has been a barrier for him to seek dental care.  He understands and agrees with the care plan,  has no further concerns at this time.     Final diagnoses:  Pain, dental    ED Discharge Orders          Ordered    amoxicillin  (AMOXIL ) 500 MG capsule  3 times daily        03/22/24 2104    ibuprofen  (ADVIL ) 600 MG tablet  Every 6 hours PRN        03/22/24 2104               Myriam Dorn BROCKS, PA 03/22/24 2107    Ruthe Cornet, DO 03/22/24 2305

## 2024-03-22 NOTE — ED Triage Notes (Signed)
 Patient states he has mouth pain and groin pain. Recently seen in the ED for same complaints.

## 2024-03-22 NOTE — ED Triage Notes (Signed)
 Pt came in via POV d/t throbbing Rt sided pain in his mouth, not sure if its a tooth or his gums. A/Ox4, rates his pain 10/10 during triage.

## 2024-03-26 ENCOUNTER — Emergency Department (HOSPITAL_COMMUNITY)
Admission: EM | Admit: 2024-03-26 | Discharge: 2024-03-27 | Disposition: A | Discharge status: Home / Self Care | Attending: Emergency Medicine | Admitting: Emergency Medicine

## 2024-03-26 ENCOUNTER — Encounter (HOSPITAL_COMMUNITY): Payer: Self-pay

## 2024-03-26 DIAGNOSIS — R252 Cramp and spasm: Secondary | ICD-10-CM | POA: Diagnosis present

## 2024-03-26 DIAGNOSIS — W19XXXA Unspecified fall, initial encounter: Secondary | ICD-10-CM

## 2024-03-26 DIAGNOSIS — Y9351 Activity, roller skating (inline) and skateboarding: Secondary | ICD-10-CM | POA: Insufficient documentation

## 2024-03-26 NOTE — ED Triage Notes (Signed)
 Pt states that he was skateboarding and fell and hit his head on the concrete. Pt reports cramping all over

## 2024-03-27 MED ORDER — KETOROLAC TROMETHAMINE 15 MG/ML IJ SOLN
15.0000 mg | Freq: Once | INTRAMUSCULAR | Status: AC
  Administered 2024-03-27: 15 mg via INTRAMUSCULAR

## 2024-03-27 MED ORDER — KETOROLAC TROMETHAMINE 15 MG/ML IJ SOLN
15.0000 mg | Freq: Once | INTRAMUSCULAR | Status: DC
  Filled 2024-03-27: qty 1

## 2024-03-27 MED ORDER — METHOCARBAMOL 500 MG PO TABS
500.0000 mg | ORAL_TABLET | Freq: Once | ORAL | Status: AC
  Administered 2024-03-27: 500 mg via ORAL
  Filled 2024-03-27: qty 1

## 2024-03-27 NOTE — Discharge Instructions (Signed)
 It was a pleasure taking care of you today. You were seen in the Emergency Department for a fall. Your work-up was reassuring.  Your exam shows no evidence of possible bleeding. Refer to the attached documentation for further management of your symptoms. Follow up with your PCP if your symptoms continue.  Please return to the ER if you experience chest pain, trouble breathing, intractable nausea/vomiting or any other life threatening illnesses.

## 2024-03-27 NOTE — ED Provider Notes (Signed)
 [place] EMERGENCY DEPARTMENT AT [facility] Provider Note   CSN: [id number] Arrival date & time: 03/26/24  2038     Patient presents with: [name]   Steven Stein is a 28 y.o. male with no pertinent past medical history who presents to the ED for evaluation after a fall.  Patient states he was riding his skateboard when he fell and hit his head.  He denies LOC.  Patient states he does not remember everything after falling.  Currently, he reports muscle cramping in his lower extremities.  He denies headache and C-spine tenderness.  Patient is well-known to the emergency department.   Fall       Prior to Admission medications   Medication Sig Start Date End Date Taking? Authorizing Provider  amoxicillin  (AMOXIL ) 500 MG capsule Take 1 capsule (500 mg total) by mouth 3 (three) times daily. 03/22/24   Myriam Dorn BROCKS, PA  ibuprofen  (ADVIL ) 600 MG tablet Take 1 tablet (600 mg total) by mouth every 6 (six) hours as needed. 03/04/24   Horton, Charmaine FALCON, MD  ibuprofen  (ADVIL ) 600 MG tablet Take 1 tablet (600 mg total) by mouth every 6 (six) hours as needed. 03/22/24   Myriam Dorn BROCKS, PA  ondansetron  (ZOFRAN ) 4 MG tablet Take 1 tablet (4 mg total) by mouth every 8 (eight) hours as needed for nausea or vomiting. 03/05/24   Smucker, Melvenia, MD  ondansetron  (ZOFRAN -ODT) 4 MG disintegrating tablet Take 1 tablet (4 mg total) by mouth every 8 (eight) hours as needed for nausea or vomiting. 03/12/24   Keith Sor, PA-C  pantoprazole  (PROTONIX ) 20 MG tablet Take 1 tablet (20 mg total) by mouth daily. 03/05/24   Smucker, Melvenia, MD    Allergies: Patient has no known allergies.    Review of Systems  Musculoskeletal:  Positive for myalgias.    Updated Vital Signs BP (!) 144/98 (BP Location: Right Arm)   Pulse 79   Temp 97.7 F (36.5 C) (Oral)   Resp 18   Ht 5' 6 (1.676 m)   Wt 65.3 kg   SpO2 100%   BMI 23.24 kg/m   Physical Exam Vitals and nursing note reviewed.   Constitutional:      Appearance: Normal appearance. He is not ill-appearing.  HENT:     Head: Normocephalic and atraumatic.     Comments: No evidence of scalp abrasion, contusion Eyes:     General: No scleral icterus.    Extraocular Movements: Extraocular movements intact.     Pupils: Pupils are equal, round, and reactive to light.  Pulmonary:     Effort: Pulmonary effort is normal. No respiratory distress.  Musculoskeletal:        General: No deformity.     Comments: C-spine nontender.  Skin:    Coloration: Skin is not jaundiced.  Neurological:     General: No focal deficit present.     Mental Status: He is alert.     Comments: Patient speaks in full goal oriented sentences. Cranial nerves 3-12 grossly intact. DTRs normal and symmetric. Equal grip strength bilateral with 5/5 strength against resistance in upper and lower extremities. No sensory or motor deficits appreciated.   Psychiatric:        Mood and Affect: Mood normal.     (all labs ordered are listed, but only abnormal results are displayed) Labs Reviewed - No data to display  EKG: None  Radiology: No results found.   Procedures   Medications Ordered in the ED  methocarbamol (  ROBAXIN) tablet 500 mg (500 mg Oral Given 03/27/24 0459)  ketorolac  (TORADOL ) 15 MG/ML injection 15 mg (15 mg Intramuscular Given 03/27/24 0459)                                   Medical Decision Making  This patient presents to the ED for concern of fall, this involves an extensive number of treatment options, and is a complaint that carries with it a high risk of complications and morbidity.  Differential diagnosis includes: Epidural hematoma, head contusion, scalp laceration, concussion  Co morbidities:  none   Additional history:   Lab Tests: Not indicated  Imaging Studies:  Per Nexus head CT criteria, imaging not indicated   Medicines ordered and prescription drug management:  I ordered medication including   Medications  methocarbamol (ROBAXIN) tablet 500 mg (500 mg Oral Given 03/27/24 0459)  ketorolac  (TORADOL ) 15 MG/ML injection 15 mg (15 mg Intramuscular Given 03/27/24 0459)   for pain trial Reevaluation of the patient after these medicines showed that the patient improved I have reviewed the patients home medicines and have made adjustments as needed  Test Considered:   none  Critical Interventions:   none  Consultations Obtained: None  Problem List / ED Course:     ICD-10-CM   1. Fall, initial encounter  W19.CHERENE       MDM: Patient is a 28 year old male with no pertinent medical history who is well-known to the ED.  He presents to the emergency department after a fall.  Patient states that he was riding his skateboard and he fell and hit his head.  He denies LOC.  His GCS is 15.  C-spine nontender.  Normal neuroexam.  No evidence of hematoma.  Nexus head CT is low.  Patient not reporting headache at this time.  He states he has leg cramping.  Patient given a dose of Toradol  and Robaxin.  Stable for discharge at this time.   Dispostion:  After consideration of the diagnostic results and the patients response to treatment, I feel that the patient would benefit from outpatient management..   Final diagnoses:  Fall, initial encounter    ED Discharge Orders     None          Steven Stein 03/27/24 9495    Steven Raynell Moder, MD 03/27/24 [phone]

## 2024-04-01 ENCOUNTER — Emergency Department (HOSPITAL_COMMUNITY)
Admission: EM | Admit: 2024-04-01 | Discharge: 2024-04-01 | Disposition: A | Discharge status: Home / Self Care | Attending: Emergency Medicine | Admitting: Emergency Medicine

## 2024-04-01 ENCOUNTER — Other Ambulatory Visit: Payer: Self-pay

## 2024-04-01 DIAGNOSIS — G4700 Insomnia, unspecified: Secondary | ICD-10-CM | POA: Insufficient documentation

## 2024-04-01 DIAGNOSIS — H538 Other visual disturbances: Secondary | ICD-10-CM | POA: Diagnosis present

## 2024-04-01 DIAGNOSIS — F0781 Postconcussional syndrome: Secondary | ICD-10-CM | POA: Diagnosis not present

## 2024-04-01 MED ORDER — HYDROXYZINE HCL 25 MG PO TABS: 25.0000 mg | ORAL_TABLET | Freq: Every day | ORAL | 0 refills | Status: AC

## 2024-04-01 NOTE — ED Notes (Signed)
Patient refused blood tests .

## 2024-04-01 NOTE — ED Provider Notes (Signed)
 [place] EMERGENCY DEPARTMENT AT [facility] Provider Note   CSN: [id number] Arrival date & time: 04/01/24  9686     Patient presents with: Blurred Vision / Hearing Loss/ Insomnia (Refused Blood Test)   Devun Anna is a 28 y.o. male who presents to the ED today out of concern for intermittent hearing loss and blurred vision, primarily concerned with insomnia.  He was seen in this ED on 7 October after having a fall with a head impact, concerned he may have had a concussion at that time.  At triage she declined any blood drawl for evaluation.  States he is also concerned for pain in his heels and his feet, has been trying ibuprofen  and Tylenol  with no relief.  Most recent lab evaluation for this patient was on 20 March 2024 which did not reveal any abnormalities at the time.   HPI     Prior to Admission medications   Medication Sig Start Date End Date Taking? Authorizing Provider  amoxicillin  (AMOXIL ) 500 MG capsule Take 1 capsule (500 mg total) by mouth 3 (three) times daily. 03/22/24   Myriam Dorn BROCKS, PA  ibuprofen  (ADVIL ) 600 MG tablet Take 1 tablet (600 mg total) by mouth every 6 (six) hours as needed. 03/04/24   Horton, Charmaine FALCON, MD  ibuprofen  (ADVIL ) 600 MG tablet Take 1 tablet (600 mg total) by mouth every 6 (six) hours as needed. 03/22/24   Myriam Dorn BROCKS, PA  ondansetron  (ZOFRAN ) 4 MG tablet Take 1 tablet (4 mg total) by mouth every 8 (eight) hours as needed for nausea or vomiting. 03/05/24   Smucker, Melvenia, MD  ondansetron  (ZOFRAN -ODT) 4 MG disintegrating tablet Take 1 tablet (4 mg total) by mouth every 8 (eight) hours as needed for nausea or vomiting. 03/12/24   Keith Sor, PA-C  pantoprazole  (PROTONIX ) 20 MG tablet Take 1 tablet (20 mg total) by mouth daily. 03/05/24   Smucker, Melvenia, MD    Allergies: Patient has no known allergies.    Review of Systems  HENT:  Positive for hearing loss.   Eyes:  Positive for visual disturbance.   Musculoskeletal:  Positive for arthralgias.  All other systems reviewed and are negative.   Updated Vital Signs BP (!) 148/88   Pulse 66   Temp 98.3 F (36.8 C) (Oral)   Resp 18   SpO2 100%   Physical Exam Vitals and nursing note reviewed.  Constitutional:      General: He is not in acute distress.    Appearance: Normal appearance. He is well-developed.  HENT:     Head: Normocephalic and atraumatic.     Mouth/Throat:     Mouth: Mucous membranes are moist.     Pharynx: Oropharynx is clear.  Eyes:     Extraocular Movements: Extraocular movements intact.     Conjunctiva/sclera: Conjunctivae normal.     Pupils: Pupils are equal, round, and reactive to light.  Cardiovascular:     Rate and Rhythm: Normal rate and regular rhythm.     Pulses: Normal pulses.     Heart sounds: Normal heart sounds. No murmur heard. Pulmonary:     Effort: Pulmonary effort is normal. No respiratory distress.     Breath sounds: Normal breath sounds.  Abdominal:     General: Abdomen is flat.     Palpations: Abdomen is soft.     Tenderness: There is no abdominal tenderness.  Musculoskeletal:        General: No swelling. Normal range of motion.  Cervical back: Neck supple.  Skin:    General: Skin is warm and dry.     Capillary Refill: Capillary refill takes less than 2 seconds.  Neurological:     General: No focal deficit present.     Mental Status: He is alert and oriented to person, place, and time. Mental status is at baseline.  Psychiatric:        Mood and Affect: Mood normal.        Behavior: Behavior normal.        Thought Content: Thought content normal.        Judgment: Judgment normal.     (all labs ordered are listed, but only abnormal results are displayed) Labs Reviewed - No data to display  EKG: None  Radiology: No results found.   Procedures   Medications Ordered in the ED - No data to display                                  Medical Decision Making  Medical  Decision Making:   Hartley Urton is a 28 y.o. male who presented to the ED today with various complaints detailed above.    External chart has been reviewed including previous labs and imaging. Complete initial physical exam performed, notably the patient  was awake and alert in no apparent distress, sleeping upon initiation of exam.    Reviewed and confirmed nursing documentation for past medical history, family history, social history.    Initial Assessment:   With the patient's presentation of various complaints, secondary to recent head injury, consider postconcussive syndrome, continuing sequelae of alcohol use disorder, chronic pain syndrome.  He has had no recent injuries to the lower extremities, primarily complains of chronic pain type symptoms.   Initial Plan:  Screening labs initially ordered at triage, patient declined. CT head offered by this provider, patient declined. Objective evaluation as below reviewed    Reassessment and Plan:   Patient largely declines workup for his symptoms, stating that he prefers to be prescribed something to help him with his sleep as well as to help with his chronic pain, specifically he is requesting Norco.  Discussed use of hydroxyzine  for short course to aid in his sleep, further discussed use of Ultram  for pain management, patient declines this stating that he has tried this in the past and has not worked.  Given largely reassuring physical exam, and declination of further evaluation, we will discharge this patient with outpatient follow-up, given prescription for hydroxyzine  to be used for sleep aid, careful return precautions given which patient understands and agrees.  Further will provide him with referral to sports med for concussion follow-up.       Final diagnoses:  Postconcussion syndrome    ED Discharge Orders     None          Myriam Dorn BROCKS, [place] 04/01/24 9166    Neysa Caron PARAS, DO 04/01/24 1053

## 2024-04-01 NOTE — ED Triage Notes (Signed)
 Patient reports intermittent /chronic hearing loss/blurred vision and insomnia . Alert and oriented , respirations unlabored, denies pain/ambulatory .

## 2024-04-01 NOTE — Discharge Instructions (Addendum)
 Referral has been given to you for follow-up regarding concussion.  We did highly recommend you follow-up with them in the next week to 2 weeks for continued management of concussion type syndrome.  Further, prescribe you a short course of hydroxyzine  to aid in your sleep.  As we discussed, would recommend that you take this approximately 1 hour before bedtime, and ensure good sleep hygiene to facilitate quality sleep.

## 2024-04-01 NOTE — ED Notes (Addendum)
 Pt will only allow this RN to place pulse oximetry for continuous monitoring.  Refusing blood work at this time.  Pt only reports headache at this time.

## 2024-04-13 ENCOUNTER — Emergency Department (HOSPITAL_COMMUNITY)
Admission: EM | Admit: 2024-04-13 | Discharge: 2024-04-13 | Discharge status: Against Medical Advice | Source: Home / Self Care

## 2024-04-26 ENCOUNTER — Encounter (HOSPITAL_COMMUNITY): Payer: Self-pay

## 2024-04-26 ENCOUNTER — Emergency Department (HOSPITAL_COMMUNITY)
Admission: EM | Admit: 2024-04-26 | Discharge: 2024-04-26 | Disposition: A | Discharge status: Home / Self Care | Attending: Emergency Medicine | Admitting: Emergency Medicine

## 2024-04-26 ENCOUNTER — Other Ambulatory Visit: Payer: Self-pay

## 2024-04-26 DIAGNOSIS — R0981 Nasal congestion: Secondary | ICD-10-CM | POA: Diagnosis present

## 2024-04-26 MED ORDER — SALINE SPRAY 0.65 % NA SOLN: 1.0000 | NASAL | 0 refills | Status: AC | PRN

## 2024-04-26 MED ORDER — FLUTICASONE PROPIONATE 50 MCG/ACT NA SUSP: 1.0000 | Freq: Every day | NASAL | 2 refills | Status: AC

## 2024-04-26 NOTE — ED Triage Notes (Signed)
 Pt states taht 2 nights ago he has a brief episode of SOB. Symptoms have subsided and pt states he just needs to be check out.

## 2024-04-26 NOTE — ED Provider Notes (Signed)
 [facility] AT [facility] Provider Note   CSN: [id number] Arrival date & time: 04/26/24  9943     Patient presents with: Shortness of Breath   Steven Stein is a 28 y.o. male.   HPI     28 year old male who presents with shortness of breath.  Patient describes nasal congestion that is worse at night.  He states that this makes it sometimes hard for him to breathe.  He is a current smoker.  No recent fevers or cough.  Symptoms seem to be improved during the daytime.  Denies chest pain.  No lower extremity swelling.  Prior to Admission medications   Medication Sig Start Date End Date Taking? Authorizing Provider  fluticasone (FLONASE) 50 MCG/ACT nasal spray Place 1 spray into both nostrils daily. 04/26/24  Yes Shallyn Constancio, Charmaine FALCON, MD  sodium chloride (OCEAN) 0.65 % SOLN nasal spray Place 1 spray into both nostrils as needed for congestion. 04/26/24  Yes Keeghan Mcintire, Charmaine FALCON, MD  amoxicillin  (AMOXIL ) 500 MG capsule Take 1 capsule (500 mg total) by mouth 3 (three) times daily. 03/22/24   Myriam Dorn BROCKS, PA  ibuprofen  (ADVIL ) 600 MG tablet Take 1 tablet (600 mg total) by mouth every 6 (six) hours as needed. 03/04/24   Sephora Boyar, Charmaine FALCON, MD  ibuprofen  (ADVIL ) 600 MG tablet Take 1 tablet (600 mg total) by mouth every 6 (six) hours as needed. 03/22/24   Myriam Dorn BROCKS, PA  ondansetron  (ZOFRAN ) 4 MG tablet Take 1 tablet (4 mg total) by mouth every 8 (eight) hours as needed for nausea or vomiting. 03/05/24   Smucker, Melvenia, MD  ondansetron  (ZOFRAN -ODT) 4 MG disintegrating tablet Take 1 tablet (4 mg total) by mouth every 8 (eight) hours as needed for nausea or vomiting. 03/12/24   Keith Sor, PA-C  pantoprazole  (PROTONIX ) 20 MG tablet Take 1 tablet (20 mg total) by mouth daily. 03/05/24   Smucker, Melvenia, MD    Allergies: Patient has no known allergies.    Review of Systems  Constitutional:  Negative for fever.  HENT:  Positive for congestion.    Respiratory:  Positive for shortness of breath. Negative for cough.   Cardiovascular:  Negative for chest pain.  All other systems reviewed and are negative.   Updated Vital Signs BP (!) 150/93 (BP Location: Right Arm)   Pulse (!) 52   Temp 98.1 F (36.7 C)   Resp 16   SpO2 97%   Physical Exam Vitals and nursing note reviewed.  Constitutional:      Appearance: He is well-developed. He is not ill-appearing.  HENT:     Head: Normocephalic and atraumatic.     Nose: Congestion present.     Mouth/Throat:     Mouth: Mucous membranes are moist.  Eyes:     Pupils: Pupils are equal, round, and reactive to light.  Cardiovascular:     Rate and Rhythm: Normal rate and regular rhythm.     Heart sounds: Normal heart sounds. No murmur heard. Pulmonary:     Effort: Pulmonary effort is normal. No respiratory distress.     Breath sounds: Normal breath sounds. No wheezing.  Abdominal:     General: Bowel sounds are normal.     Palpations: Abdomen is soft.     Tenderness: There is no abdominal tenderness. There is no rebound.  Musculoskeletal:     Cervical back: Neck supple.  Lymphadenopathy:     Cervical: No cervical adenopathy.  Skin:    General: Skin  is warm and dry.  Neurological:     Mental Status: He is alert and oriented to person, place, and time.  Psychiatric:        Mood and Affect: Mood normal.     (all labs ordered are listed, but only abnormal results are displayed) Labs Reviewed - No data to display  EKG: None  Radiology: No results found.   Procedures   Medications Ordered in the ED - No data to display                                  Medical Decision Making Risk OTC drugs.   This patient presents to the ED for concern of congestion, shortness of breath, this involves an extensive number of treatment options, and is a complaint that carries with it a high risk of complications and morbidity.  I considered the following differential and admission for  this acute, potentially life threatening condition.  The differential diagnosis includes viral illness, allergic rhinitis, less likely pneumonia  MDM:    This is a 28 year old male who presents with shortness of breath.  After his description, more described as nasal congestion causing difficulty breathing mostly at night.  His breath sounds are clear.  O2 sats 97%.  He is on room air and is in no respiratory distress.  Do not feel he needs x-ray or extensive testing at this time.  This could be viral or allergic in nature.  Recommend Flonase and nasal saline.  Do not feel he needs significant workup.  (Labs, imaging, consults)  Labs: I Ordered, and personally interpreted labs.  The pertinent results include: None  Imaging Studies ordered: I ordered imaging studies including none I independently visualized and interpreted imaging. I agree with the radiologist interpretation  Additional history obtained from chart review.  External records from outside source obtained and reviewed including prior evaluations  Cardiac Monitoring: The patient was not maintained on a cardiac monitor.  If on the cardiac monitor, I personally viewed and interpreted the cardiac monitored which showed an underlying rhythm of: N/A  Reevaluation: After the interventions noted above, I reevaluated the patient and found that they have :stayed the same  Social Determinants of Health:  lives independently  Disposition: Discharge  Co morbidities that complicate the patient evaluation History reviewed. No pertinent past medical history.   Medicines Meds ordered this encounter  Medications   fluticasone (FLONASE) 50 MCG/ACT nasal spray    Sig: Place 1 spray into both nostrils daily.    Dispense:  16 g    Refill:  2   sodium chloride (OCEAN) 0.65 % SOLN nasal spray    Sig: Place 1 spray into both nostrils as needed for congestion.    Dispense:  480 mL    Refill:  0    I have reviewed the patients home  medicines and have made adjustments as needed  Problem List / ED Course: Problem List Items Addressed This Visit   None Visit Diagnoses       Nasal congestion    -  Primary                Final diagnoses:  Nasal congestion    ED Discharge Orders          Ordered    fluticasone (FLONASE) 50 MCG/ACT nasal spray  Daily        04/26/24 0210    sodium chloride (OCEAN)  0.65 % SOLN nasal spray  As needed        04/26/24 0210               Bari Charmaine FALCON, MD 04/26/24 [phone]
